# Patient Record
Sex: Female | Born: 1980 | Race: White | Hispanic: No
Health system: Southern US, Community
[De-identification: ages and names within clinical notes are randomized; demographics above are authoritative.]

---

## 2020-08-26 ENCOUNTER — Encounter: Payer: Self-pay | Admitting: Emergency Medicine

## 2020-08-26 ENCOUNTER — Other Ambulatory Visit: Payer: Self-pay

## 2020-08-26 ENCOUNTER — Emergency Department
Admission: EM | Admit: 2020-08-26 | Discharge: 2020-08-26 | Disposition: A | Discharge status: Home / Self Care | Payer: Medicaid Other | Attending: Emergency Medicine | Admitting: Emergency Medicine

## 2020-08-26 DIAGNOSIS — Z79890 Hormone replacement therapy (postmenopausal): Secondary | ICD-10-CM | POA: Diagnosis not present

## 2020-08-26 DIAGNOSIS — Z79899 Other long term (current) drug therapy: Secondary | ICD-10-CM | POA: Diagnosis not present

## 2020-08-26 DIAGNOSIS — S161XXA Strain of muscle, fascia and tendon at neck level, initial encounter: Secondary | ICD-10-CM | POA: Insufficient documentation

## 2020-08-26 MED ORDER — PREDNISONE 10 MG PO TABS: 10.0000 mg | ORAL_TABLET | Freq: Every day | ORAL | 0 refills | Status: AC

## 2020-08-26 MED ORDER — HYDROCODONE-ACETAMINOPHEN 5-325 MG PO TABS
1.0000 | ORAL_TABLET | ORAL | 0 refills | Status: AC | PRN
Start: 2020-08-26 — End: ?

## 2020-08-26 MED ORDER — TIZANIDINE HCL 4 MG PO TABS: 4.0000 mg | ORAL_TABLET | Freq: Three times a day (TID) | ORAL | 0 refills | Status: AC

## 2020-08-26 NOTE — Discharge Instructions (Signed)
Please take medications as prescribed.  Work on gentle stretching exercises.  If any increasing pain return to the emergency department.  Follow-up PCP if no improvement in 1 week.
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
## 2021-06-15 ENCOUNTER — Emergency Department (HOSPITAL_COMMUNITY): Payer: Medicaid Other

## 2021-06-15 ENCOUNTER — Inpatient Hospital Stay (HOSPITAL_COMMUNITY)
Admission: EM | Admit: 2021-06-15 | Discharge: 2021-07-03 | DRG: 917 | Disposition: A | Discharge status: Hospice | Payer: Medicaid Other | Attending: Family Medicine | Admitting: Family Medicine

## 2021-06-15 DIAGNOSIS — R1110 Vomiting, unspecified: Secondary | ICD-10-CM

## 2021-06-15 DIAGNOSIS — Z9911 Dependence on respirator [ventilator] status: Secondary | ICD-10-CM | POA: Diagnosis not present

## 2021-06-15 DIAGNOSIS — E43 Unspecified severe protein-calorie malnutrition: Secondary | ICD-10-CM | POA: Insufficient documentation

## 2021-06-15 DIAGNOSIS — F32A Depression, unspecified: Secondary | ICD-10-CM | POA: Diagnosis present

## 2021-06-15 DIAGNOSIS — Z452 Encounter for adjustment and management of vascular access device: Secondary | ICD-10-CM

## 2021-06-15 DIAGNOSIS — J9601 Acute respiratory failure with hypoxia: Secondary | ICD-10-CM | POA: Diagnosis present

## 2021-06-15 DIAGNOSIS — D72819 Decreased white blood cell count, unspecified: Secondary | ICD-10-CM | POA: Diagnosis present

## 2021-06-15 DIAGNOSIS — F419 Anxiety disorder, unspecified: Secondary | ICD-10-CM | POA: Diagnosis present

## 2021-06-15 DIAGNOSIS — R64 Cachexia: Secondary | ICD-10-CM | POA: Diagnosis present

## 2021-06-15 DIAGNOSIS — I5181 Takotsubo syndrome: Secondary | ICD-10-CM | POA: Diagnosis present

## 2021-06-15 DIAGNOSIS — L89151 Pressure ulcer of sacral region, stage 1: Secondary | ICD-10-CM | POA: Diagnosis present

## 2021-06-15 DIAGNOSIS — Z4659 Encounter for fitting and adjustment of other gastrointestinal appliance and device: Secondary | ICD-10-CM

## 2021-06-15 DIAGNOSIS — I5021 Acute systolic (congestive) heart failure: Secondary | ICD-10-CM | POA: Diagnosis present

## 2021-06-15 DIAGNOSIS — T402X1A Poisoning by other opioids, accidental (unintentional), initial encounter: Principal | ICD-10-CM | POA: Diagnosis present

## 2021-06-15 DIAGNOSIS — R579 Shock, unspecified: Secondary | ICD-10-CM | POA: Diagnosis not present

## 2021-06-15 DIAGNOSIS — E039 Hypothyroidism, unspecified: Secondary | ICD-10-CM | POA: Diagnosis present

## 2021-06-15 DIAGNOSIS — B9561 Methicillin susceptible Staphylococcus aureus infection as the cause of diseases classified elsewhere: Secondary | ICD-10-CM | POA: Diagnosis not present

## 2021-06-15 DIAGNOSIS — J690 Pneumonitis due to inhalation of food and vomit: Secondary | ICD-10-CM | POA: Diagnosis present

## 2021-06-15 DIAGNOSIS — G931 Anoxic brain damage, not elsewhere classified: Secondary | ICD-10-CM | POA: Diagnosis present

## 2021-06-15 DIAGNOSIS — R627 Adult failure to thrive: Secondary | ICD-10-CM | POA: Diagnosis present

## 2021-06-15 DIAGNOSIS — Z515 Encounter for palliative care: Secondary | ICD-10-CM

## 2021-06-15 DIAGNOSIS — R778 Other specified abnormalities of plasma proteins: Secondary | ICD-10-CM

## 2021-06-15 DIAGNOSIS — Z978 Presence of other specified devices: Secondary | ICD-10-CM

## 2021-06-15 DIAGNOSIS — G2581 Restless legs syndrome: Secondary | ICD-10-CM | POA: Diagnosis present

## 2021-06-15 DIAGNOSIS — Z9884 Bariatric surgery status: Secondary | ICD-10-CM

## 2021-06-15 DIAGNOSIS — Z66 Do not resuscitate: Secondary | ICD-10-CM | POA: Diagnosis not present

## 2021-06-15 DIAGNOSIS — Z681 Body mass index (BMI) 19 or less, adult: Secondary | ICD-10-CM | POA: Diagnosis not present

## 2021-06-15 DIAGNOSIS — M6282 Rhabdomyolysis: Secondary | ICD-10-CM | POA: Diagnosis present

## 2021-06-15 DIAGNOSIS — K219 Gastro-esophageal reflux disease without esophagitis: Secondary | ICD-10-CM | POA: Diagnosis present

## 2021-06-15 DIAGNOSIS — Z781 Physical restraint status: Secondary | ICD-10-CM

## 2021-06-15 DIAGNOSIS — L89311 Pressure ulcer of right buttock, stage 1: Secondary | ICD-10-CM | POA: Diagnosis present

## 2021-06-15 DIAGNOSIS — E8809 Other disorders of plasma-protein metabolism, not elsewhere classified: Secondary | ICD-10-CM | POA: Diagnosis present

## 2021-06-15 DIAGNOSIS — Z9151 Personal history of suicidal behavior: Secondary | ICD-10-CM

## 2021-06-15 DIAGNOSIS — G928 Other toxic encephalopathy: Secondary | ICD-10-CM | POA: Diagnosis present

## 2021-06-15 DIAGNOSIS — R680 Hypothermia, not associated with low environmental temperature: Secondary | ICD-10-CM | POA: Diagnosis present

## 2021-06-15 DIAGNOSIS — Z0189 Encounter for other specified special examinations: Secondary | ICD-10-CM

## 2021-06-15 DIAGNOSIS — G9340 Encephalopathy, unspecified: Secondary | ICD-10-CM | POA: Diagnosis present

## 2021-06-15 DIAGNOSIS — L89321 Pressure ulcer of left buttock, stage 1: Secondary | ICD-10-CM | POA: Diagnosis present

## 2021-06-15 DIAGNOSIS — Z20822 Contact with and (suspected) exposure to covid-19: Secondary | ICD-10-CM | POA: Diagnosis present

## 2021-06-15 DIAGNOSIS — E162 Hypoglycemia, unspecified: Secondary | ICD-10-CM

## 2021-06-15 DIAGNOSIS — Z635 Disruption of family by separation and divorce: Secondary | ICD-10-CM

## 2021-06-15 DIAGNOSIS — R451 Restlessness and agitation: Secondary | ICD-10-CM | POA: Diagnosis not present

## 2021-06-15 DIAGNOSIS — J9621 Acute and chronic respiratory failure with hypoxia: Secondary | ICD-10-CM

## 2021-06-15 DIAGNOSIS — E54 Ascorbic acid deficiency: Secondary | ICD-10-CM | POA: Diagnosis present

## 2021-06-15 DIAGNOSIS — Z01818 Encounter for other preprocedural examination: Secondary | ICD-10-CM

## 2021-06-15 DIAGNOSIS — L8990 Pressure ulcer of unspecified site, unspecified stage: Secondary | ICD-10-CM | POA: Insufficient documentation

## 2021-06-15 DIAGNOSIS — E876 Hypokalemia: Secondary | ICD-10-CM | POA: Diagnosis present

## 2021-06-15 DIAGNOSIS — Z7189 Other specified counseling: Secondary | ICD-10-CM

## 2021-06-15 LAB — URINALYSIS, ROUTINE W REFLEX MICROSCOPIC
Bacteria, UA: NONE SEEN
Bilirubin Urine: NEGATIVE
Glucose, UA: 150 mg/dL — AB
Ketones, ur: NEGATIVE mg/dL
Leukocytes,Ua: NEGATIVE
Nitrite: NEGATIVE
Protein, ur: 30 mg/dL — AB
Specific Gravity, Urine: 1.016 (ref 1.005–1.030)
pH: 5 (ref 5.0–8.0)

## 2021-06-15 LAB — COMPREHENSIVE METABOLIC PANEL
ALT: 45 U/L — ABNORMAL HIGH (ref 0–44)
AST: 61 U/L — ABNORMAL HIGH (ref 15–41)
Albumin: 2.3 g/dL — ABNORMAL LOW (ref 3.5–5.0)
Alkaline Phosphatase: 79 U/L (ref 38–126)
Anion gap: 7 (ref 5–15)
BUN: 21 mg/dL — ABNORMAL HIGH (ref 6–20)
CO2: 28 mmol/L (ref 22–32)
Calcium: 7.7 mg/dL — ABNORMAL LOW (ref 8.9–10.3)
Chloride: 110 mmol/L (ref 98–111)
Creatinine, Ser: 0.8 mg/dL (ref 0.44–1.00)
GFR, Estimated: >60 mL/min (ref >60)
Glucose, Bld: 41 mg/dL — CL (ref 70–99)
Potassium: 2.6 mmol/L — CL (ref 3.5–5.1)
Sodium: 145 mmol/L (ref 135–145)
Total Bilirubin: 0.5 mg/dL (ref 0.3–1.2)
Total Protein: 5.5 g/dL — ABNORMAL LOW (ref 6.5–8.1)

## 2021-06-15 LAB — CBC WITH DIFFERENTIAL/PLATELET
Abs Immature Granulocytes: 0 10*3/uL (ref 0.00–0.07)
Basophils Absolute: 0 10*3/uL (ref 0.0–0.1)
Basophils Relative: 1 %
Eosinophils Absolute: 0 10*3/uL (ref 0.0–0.5)
Eosinophils Relative: 0 %
HCT: 42.5 % (ref 36.0–46.0)
Hemoglobin: 12.9 g/dL (ref 12.0–15.0)
Lymphocytes Relative: 19 %
Lymphs Abs: 0.5 10*3/uL — ABNORMAL LOW (ref 0.7–4.0)
MCH: 27.3 pg (ref 26.0–34.0)
MCHC: 30.4 g/dL (ref 30.0–36.0)
MCV: 90 fL (ref 80.0–100.0)
Monocytes Absolute: 0.1 10*3/uL (ref 0.1–1.0)
Monocytes Relative: 4 %
Neutro Abs: 1.9 10*3/uL (ref 1.7–7.7)
Neutrophils Relative %: 76 %
Platelets: 218 10*3/uL (ref 150–400)
RBC: 4.72 MIL/uL (ref 3.87–5.11)
RDW: 22.5 % — ABNORMAL HIGH (ref 11.5–15.5)
WBC: 2.5 10*3/uL — ABNORMAL LOW (ref 4.0–10.5)
nRBC: 0 % (ref 0.0–0.2)
nRBC: 0 /100 WBC

## 2021-06-15 LAB — PROTIME-INR
INR: 1.1 (ref 0.8–1.2)
Prothrombin Time: 13.7 seconds (ref 11.4–15.2)

## 2021-06-15 LAB — I-STAT ARTERIAL BLOOD GAS, ED
Acid-Base Excess: 1 mmol/L (ref 0.0–2.0)
Bicarbonate: 27.3 mmol/L (ref 20.0–28.0)
Calcium, Ion: 1.15 mmol/L (ref 1.15–1.40)
HCT: 41 % (ref 36.0–46.0)
Hemoglobin: 13.9 g/dL (ref 12.0–15.0)
O2 Saturation: 89 %
Patient temperature: 96.7
Potassium: 2.6 mmol/L — CL (ref 3.5–5.1)
Sodium: 146 mmol/L — ABNORMAL HIGH (ref 135–145)
TCO2: 29 mmol/L (ref 22–32)
pCO2 arterial: 47.8 mmHg (ref 32.0–48.0)
pH, Arterial: 7.36 (ref 7.350–7.450)
pO2, Arterial: 56 mmHg — ABNORMAL LOW (ref 83.0–108.0)

## 2021-06-15 LAB — I-STAT BETA HCG BLOOD, ED (MC, WL, AP ONLY): I-stat hCG, quantitative: <5 m[IU]/mL (ref <5)

## 2021-06-15 LAB — CBG MONITORING, ED
Glucose-Capillary: 12 mg/dL — CL (ref 70–99)
Glucose-Capillary: 344 mg/dL — ABNORMAL HIGH (ref 70–99)
Glucose-Capillary: 87 mg/dL (ref 70–99)
Glucose-Capillary: 89 mg/dL (ref 70–99)

## 2021-06-15 LAB — ACETAMINOPHEN LEVEL: Acetaminophen (Tylenol), Serum: <10 ug/mL — ABNORMAL LOW (ref 10–30)

## 2021-06-15 LAB — RAPID URINE DRUG SCREEN, HOSP PERFORMED
Amphetamines: NOT DETECTED
Barbiturates: NOT DETECTED
Benzodiazepines: NOT DETECTED
Cocaine: NOT DETECTED
Opiates: NOT DETECTED
Tetrahydrocannabinol: NOT DETECTED

## 2021-06-15 LAB — MRSA NEXT GEN BY PCR, NASAL: MRSA by PCR Next Gen: NOT DETECTED

## 2021-06-15 LAB — TSH: TSH: 5.42 u[IU]/mL — ABNORMAL HIGH (ref 0.350–4.500)

## 2021-06-15 LAB — LACTIC ACID, PLASMA: Lactic Acid, Venous: 1.9 mmol/L (ref 0.5–1.9)

## 2021-06-15 LAB — ETHANOL: Alcohol, Ethyl (B): <10 mg/dL (ref <10)

## 2021-06-15 LAB — RESP PANEL BY RT-PCR (FLU A&B, COVID) ARPGX2
Influenza A by PCR: NEGATIVE
Influenza B by PCR: NEGATIVE
SARS Coronavirus 2 by RT PCR: NEGATIVE

## 2021-06-15 LAB — GLUCOSE, CAPILLARY
Glucose-Capillary: 108 mg/dL — ABNORMAL HIGH (ref 70–99)
Glucose-Capillary: 123 mg/dL — ABNORMAL HIGH (ref 70–99)
Glucose-Capillary: 141 mg/dL — ABNORMAL HIGH (ref 70–99)
Glucose-Capillary: 54 mg/dL — ABNORMAL LOW (ref 70–99)

## 2021-06-15 LAB — STREP PNEUMONIAE URINARY ANTIGEN: Strep Pneumo Urinary Antigen: NEGATIVE

## 2021-06-15 LAB — TROPONIN I (HIGH SENSITIVITY): Troponin I (High Sensitivity): 2116 ng/L (ref <18)

## 2021-06-15 LAB — SALICYLATE LEVEL: Salicylate Lvl: <7 mg/dL — ABNORMAL LOW (ref 7.0–30.0)
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
## 2021-06-15 IMAGING — DX DG CHEST 1V PORT
2 series · 2 of 2 positions shown · non-contrast
Comparison: None.

CLINICAL DATA: Onset headache and chest pain this morning.

EXAM:
PORTABLE CHEST 1 VIEW

[chest ap (1 of 2)]
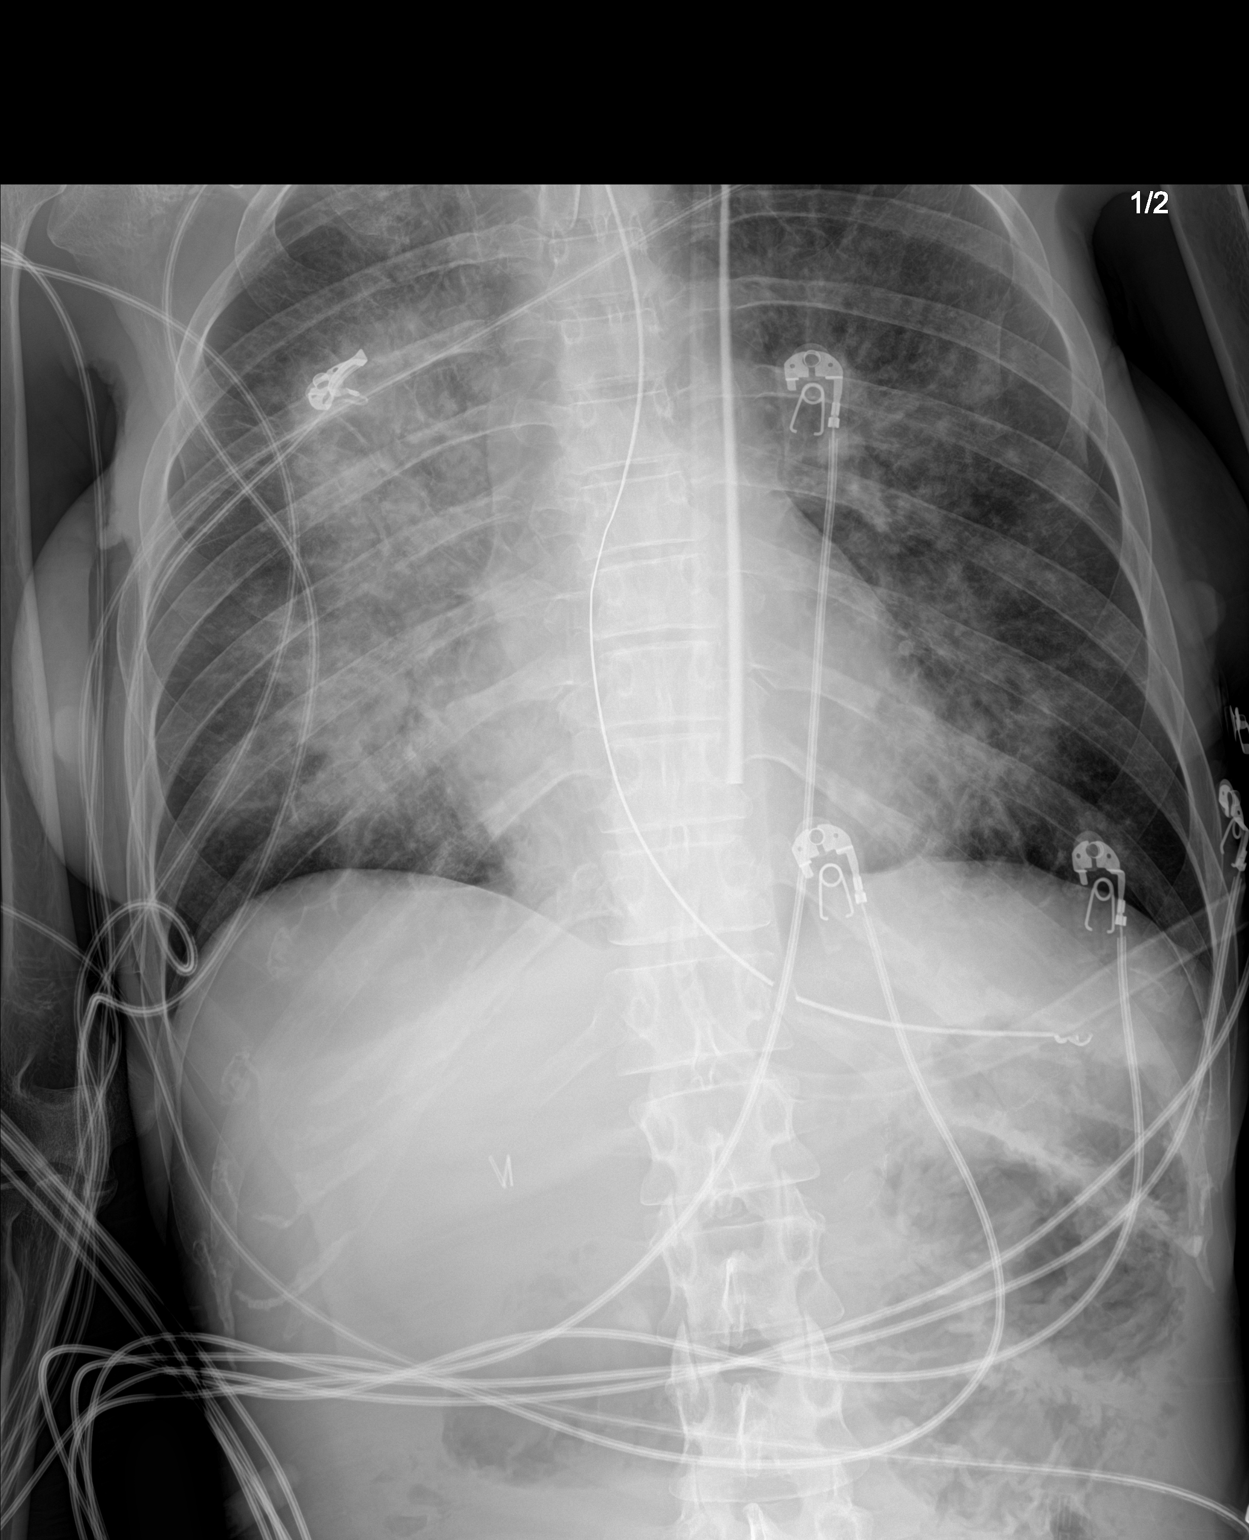

[chest ap (2 of 2)]
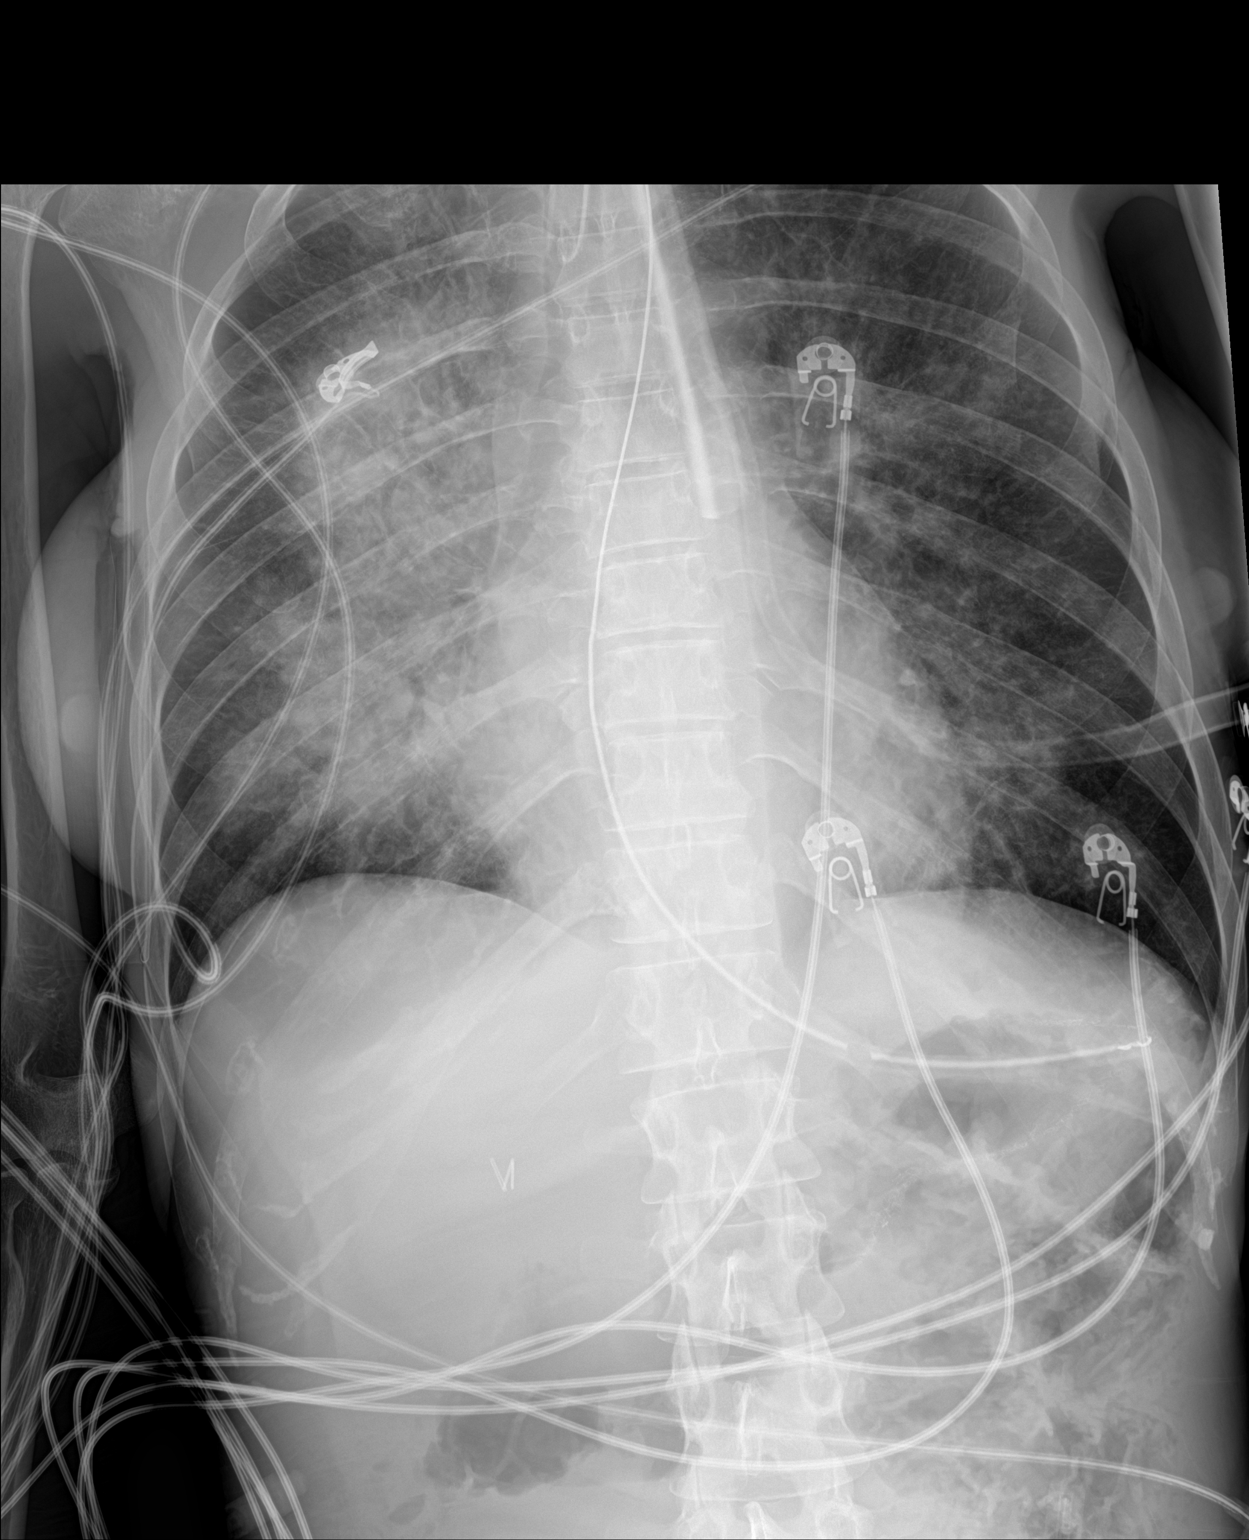

[2 of 2 positions shown; findings below may reference images not displayed]

FINDINGS: Endotracheal tube is in place with the tip in good position just
below the clavicular heads. NG tube tip is in the fundus of the
stomach. There are bilateral pulmonary opacities, worse on the
right. No pneumothorax or pleural effusion. Heart size is normal.
IMPRESSION: ETT and NG tube in good position.

Right greater than left perihilar opacities could be due to
pneumonia or edema.
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
## 2021-06-15 MED ORDER — PROPOFOL 1000 MG/100ML IV EMUL: 5.0000 ug/kg/min | INTRAVENOUS | Status: DC

## 2021-06-15 MED ORDER — FENTANYL CITRATE (PF) 100 MCG/2ML IJ SOLN
12.5000 ug | INTRAMUSCULAR | Status: DC | PRN
  Administered 2021-06-15: 50 ug via INTRAVENOUS
  Filled 2021-06-15: qty 2

## 2021-06-15 MED ORDER — DEXTROSE 50 % IV SOLN
INTRAVENOUS | Status: AC
  Administered 2021-06-15: 50 mL
  Filled 2021-06-15: qty 50

## 2021-06-15 MED ORDER — POTASSIUM CHLORIDE 20 MEQ PO PACK
40.0000 meq | PACK | Freq: Two times a day (BID) | ORAL | Status: AC
  Administered 2021-06-15 (×2): 40 meq
  Filled 2021-06-15 (×2): qty 2

## 2021-06-15 MED ORDER — SODIUM CHLORIDE 0.9 % IV SOLN: 250.0000 mL | INTRAVENOUS | Status: DC

## 2021-06-15 MED ORDER — POTASSIUM CHLORIDE 10 MEQ/100ML IV SOLN
10.0000 meq | INTRAVENOUS | Status: AC
  Administered 2021-06-15 (×4): 10 meq via INTRAVENOUS
  Filled 2021-06-15 (×2): qty 100

## 2021-06-15 MED ORDER — PROPOFOL 1000 MG/100ML IV EMUL
5.0000 ug/kg/min | INTRAVENOUS | Status: DC
  Administered 2021-06-15: 10 ug/kg/min via INTRAVENOUS

## 2021-06-15 MED ORDER — POLYETHYLENE GLYCOL 3350 17 G PO PACK
17.0000 g | PACK | Freq: Every day | ORAL | Status: DC
  Administered 2021-06-15 – 2021-06-20 (×4): 17 g
  Filled 2021-06-15 (×3): qty 1

## 2021-06-15 MED ORDER — ORAL CARE MOUTH RINSE
15.0000 mL | OROMUCOSAL | Status: DC
  Administered 2021-06-15 – 2021-06-28 (×127): 15 mL via OROMUCOSAL

## 2021-06-15 MED ORDER — DOCUSATE SODIUM 50 MG/5ML PO LIQD
100.0000 mg | Freq: Two times a day (BID) | ORAL | Status: DC
  Administered 2021-06-15 – 2021-06-17 (×5): 100 mg
  Filled 2021-06-15 (×6): qty 10

## 2021-06-15 MED ORDER — OCTREOTIDE ACETATE 50 MCG/ML IJ SOLN
50.0000 ug | Freq: Once | INTRAMUSCULAR | Status: AC
  Administered 2021-06-15: 50 ug via SUBCUTANEOUS
  Filled 2021-06-15: qty 1

## 2021-06-15 MED ORDER — DEXMEDETOMIDINE HCL IN NACL 400 MCG/100ML IV SOLN
0.0000 ug/kg/h | INTRAVENOUS | Status: AC
  Administered 2021-06-15: 0.4 ug/kg/h via INTRAVENOUS
  Administered 2021-06-16 (×2): 1 ug/kg/h via INTRAVENOUS
  Filled 2021-06-15 (×5): qty 100

## 2021-06-15 MED ORDER — PANTOPRAZOLE SODIUM 40 MG PO PACK
40.0000 mg | PACK | Freq: Every day | ORAL | Status: DC
  Administered 2021-06-16 – 2021-07-01 (×16): 40 mg
  Filled 2021-06-15 (×16): qty 20

## 2021-06-15 MED ORDER — FENTANYL CITRATE (PF) 100 MCG/2ML IJ SOLN
25.0000 ug | INTRAMUSCULAR | Status: DC | PRN
  Administered 2021-06-15 – 2021-06-18 (×2): 100 ug via INTRAVENOUS
  Administered 2021-06-18: 50 ug via INTRAVENOUS
  Administered 2021-06-18 – 2021-06-23 (×8): 100 ug via INTRAVENOUS
  Filled 2021-06-15 (×12): qty 2

## 2021-06-15 MED ORDER — ONDANSETRON HCL 4 MG/2ML IJ SOLN: 4.0000 mg | Freq: Four times a day (QID) | INTRAMUSCULAR | Status: DC | PRN

## 2021-06-15 MED ORDER — CHLORHEXIDINE GLUCONATE 0.12% ORAL RINSE (MEDLINE KIT)
15.0000 mL | Freq: Two times a day (BID) | OROMUCOSAL | Status: DC
  Administered 2021-06-15 – 2021-07-01 (×32): 15 mL via OROMUCOSAL

## 2021-06-15 MED ORDER — DEXTROSE 5 % IV SOLN: INTRAVENOUS | Status: DC

## 2021-06-15 MED ORDER — ETOMIDATE 2 MG/ML IV SOLN
INTRAVENOUS | Status: AC | PRN
  Administered 2021-06-15: 20 mg via INTRAVENOUS

## 2021-06-15 MED ORDER — ROCURONIUM BROMIDE 50 MG/5ML IV SOLN
INTRAVENOUS | Status: AC | PRN
  Administered 2021-06-15: 60 mg/kg/h via INTRAVENOUS

## 2021-06-15 MED ORDER — SODIUM CHLORIDE 0.9 % IV BOLUS
1000.0000 mL | Freq: Once | INTRAVENOUS | Status: AC
  Administered 2021-06-15: 1000 mL via INTRAVENOUS

## 2021-06-15 MED ORDER — DEXTROSE 50 % IV SOLN
2.0000 | Freq: Once | INTRAVENOUS | Status: AC
  Administered 2021-06-15: 100 mL via INTRAVENOUS

## 2021-06-15 MED ORDER — HEPARIN SODIUM (PORCINE) 5000 UNIT/ML IJ SOLN
5000.0000 [IU] | Freq: Three times a day (TID) | INTRAMUSCULAR | Status: DC
  Administered 2021-06-15 – 2021-06-16 (×2): 5000 [IU] via SUBCUTANEOUS
  Filled 2021-06-15 (×2): qty 1

## 2021-06-15 MED ORDER — FENTANYL CITRATE (PF) 100 MCG/2ML IJ SOLN: 25.0000 ug | INTRAMUSCULAR | Status: DC | PRN

## 2021-06-15 MED ORDER — POTASSIUM CHLORIDE 10 MEQ/100ML IV SOLN
10.0000 meq | INTRAVENOUS | Status: AC
  Administered 2021-06-15 – 2021-06-16 (×3): 10 meq via INTRAVENOUS
  Filled 2021-06-15 (×3): qty 100

## 2021-06-15 MED ORDER — CHLORHEXIDINE GLUCONATE CLOTH 2 % EX PADS
6.0000 | MEDICATED_PAD | Freq: Every day | CUTANEOUS | Status: DC
  Administered 2021-06-16 – 2021-07-01 (×16): 6 via TOPICAL

## 2021-06-15 MED ORDER — DOCUSATE SODIUM 100 MG PO CAPS: 100.0000 mg | ORAL_CAPSULE | Freq: Two times a day (BID) | ORAL | Status: DC | PRN

## 2021-06-15 MED ORDER — NOREPINEPHRINE 4 MG/250ML-% IV SOLN
2.0000 ug/min | INTRAVENOUS | Status: DC
  Administered 2021-06-15: 5 ug/min via INTRAVENOUS
  Administered 2021-06-16: 9 ug/min via INTRAVENOUS
  Administered 2021-06-17: 6 ug/min via INTRAVENOUS
  Filled 2021-06-15 (×2): qty 250
  Filled 2021-06-15: qty 500

## 2021-06-15 MED ORDER — SODIUM CHLORIDE 0.9 % IV SOLN: INTRAVENOUS | Status: DC

## 2021-06-15 MED ORDER — PIPERACILLIN-TAZOBACTAM 3.375 G IVPB
3.3750 g | Freq: Three times a day (TID) | INTRAVENOUS | Status: DC
  Administered 2021-06-15 – 2021-06-16 (×2): 3.375 g via INTRAVENOUS
  Filled 2021-06-15 (×2): qty 50

## 2021-06-15 MED ORDER — MAGNESIUM SULFATE 2 GM/50ML IV SOLN
2.0000 g | Freq: Once | INTRAVENOUS | Status: AC
  Administered 2021-06-15: 2 g via INTRAVENOUS
  Filled 2021-06-15: qty 50

## 2021-06-15 MED ORDER — POLYETHYLENE GLYCOL 3350 17 G PO PACK
17.0000 g | PACK | Freq: Every day | ORAL | Status: DC | PRN
  Filled 2021-06-15: qty 1

## 2021-06-15 NOTE — Progress Notes (Signed)
Pt transported to 3M05 without any apparent complications.

## 2021-06-15 NOTE — Progress Notes (Addendum)
eLink Physician-Brief Progress Note Patient Name: Sonia Howard DOB: [birth year] MRN: [id number]   Date of Service  06/15/2021  HPI/Events of Note  Bedside RN asking for PRN order for sedation, as patient is becoming slightly more responsive, she also threw up enteral K+ replacement so bedside RN is asking for a run of iv K+ replacement.  eICU Interventions  PRN Fentanyl ordered, KCL 30 meq iv over 3 hours ordered.        [name] 06/15/2021, 10:16 PM
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
## 2021-06-15 NOTE — ED Notes (Signed)
Peri care preformed before and after foley placement

## 2021-06-15 NOTE — Progress Notes (Signed)
Pt transported to CT and back to ED room 15.

## 2021-06-15 NOTE — Code Documentation (Signed)
Good color change. Bilateral breath sound heard. 25 at the lip

## 2021-06-15 NOTE — Progress Notes (Signed)
eLink Physician-Brief Progress Note Patient Name: Sonia Howard DOB: [birth year] MRN: [id number]   Date of Service  06/15/2021  HPI/Events of Note  Patient with sub-optimal sedation.  eICU Interventions  Precedex gtt started and PRN Fentanyl dosing changed to 25-100 mcg iv Q 4 hours PRN.        Thomasene Lot Zakariah Urwin 06/15/2021, 10:51 PM

## 2021-06-15 NOTE — Progress Notes (Signed)
Pharmacy Antibiotic Note  Sonia Howard is a 40 y.o. female admitted on 06/15/2021 presenting unresponsive, intubated in ED and concern for aspiration.  Pharmacy has been consulted for Zosyn dosing.  Plan: Zosyn 3.375g IV every 8 hours (extended infusion) Monitor renal function, clinical progression and LOT  Height: 5\' 8"  (172.7 cm) Weight: 60 kg (132 lb 4.4 oz) IBW/kg (Calculated) : 63.9  Temp (24hrs), Avg:96.8 F (36 C), Min:95.9 F (35.5 C), Max:97.7 F (36.5 C)  Recent Labs  Lab 06/15/21 1322  WBC 2.5*  CREATININE 0.80  LATICACIDVEN 1.9    Estimated Creatinine Clearance: 88.5 mL/min (by C-G formula based on SCr of 0.8 mg/dL).    Not on File  08/16/21, PharmD Clinical Pharmacist ED Pharmacist Phone # [phone] 06/15/2021 5:41 PM

## 2021-06-15 NOTE — ED Provider Notes (Signed)
Emergency Department Provider Note   I have reviewed the triage vital signs and the nursing notes.   HISTORY  Chief Complaint No chief complaint on file.   HPI Sonia Howard is a 40 y.o. female with reported past history of overdose and unintentional weight loss presents to the emergency department after being found unresponsive by family.  Patient was at a friend's house.  She was last seen normal at 5 PM yesterday.  Family called 911 when the patient was found minimally responsive.  EMS report that on scene patient was breathing spontaneously but was otherwise unresponsive.  Patient had a blood sugar of 23 and was given 2 A of D50 increasing it into the 250 range.  They gave 2 mg of Narcan with no response.  The patient required assisted ventilation with bag-valve-mask.  IV fluids were started and the patient was transported emergently to [facility].  No additional information is available at the time of arrival.  Level 5 caveat: Unresponsive    No past medical history on file.  There are no problems to display for this patient.   Allergies Patient has no allergy information on record.  No family history on file.  Social History    Review of Systems  Level 5 caveat: Unresponsive   ____________________________________________   PHYSICAL EXAM:  VITAL SIGNS: ED Triage Vitals [06/15/21 1316]  Enc Vitals Group     BP 122/68     Pulse Rate (!) 111     Resp (!) 22     Temp      Temp src      SpO2 96 %   Constitutional: Unresponsive.  Eyes: Conjunctivae are normal. Pupils are 7 mm and sluggish bilaterally. Mild periorbital edema to the left eye.  Head: Atraumatic. Nose: No congestion/rhinnorhea. Mouth/Throat: Mucous membranes are moist.  Neck: No stridor.   Cardiovascular: Tachycardia. Good peripheral circulation. Grossly normal heart sounds.   Respiratory: Agonal respirations but regular.  No retractions. Lungs CTAB. Gastrointestinal: Soft and nontender. No  distention.  Musculoskeletal: No lower extremity tenderness with 1+ pitting edema in the B/L LEs. No gross deformities of extremities. Neurologic: Spontaneous respirations but no other spontaneous movement. Not responding to painful stimuli. Positive gag reflex.  Skin:  Skin is warm, dry and intact. No rash noted.  ____________________________________________   LABS (all labs ordered are listed, but only abnormal results are displayed)  Labs Reviewed  ACETAMINOPHEN LEVEL - Abnormal; Notable for the following components:      Result Value   Acetaminophen (Tylenol), Serum <10 (*)    All other components within normal limits  COMPREHENSIVE METABOLIC PANEL - Abnormal; Notable for the following components:   Potassium 2.6 (*)    Glucose, Bld 41 (*)    BUN 21 (*)    Calcium 7.7 (*)    Total Protein 5.5 (*)    Albumin 2.3 (*)    AST 61 (*)    ALT 45 (*)    All other components within normal limits  SALICYLATE LEVEL - Abnormal; Notable for the following components:   Salicylate Lvl <7.0 (*)    All other components within normal limits  CBC WITH DIFFERENTIAL/PLATELET - Abnormal; Notable for the following components:   WBC 2.5 (*)    RDW 22.5 (*)    Lymphs Abs 0.5 (*)    All other components within normal limits  TSH - Abnormal; Notable for the following components:   TSH 5.420 (*)    All other components within  normal limits  CBG MONITORING, ED - Abnormal; Notable for the following components:   Glucose-Capillary 12 (*)    All other components within normal limits  CBG MONITORING, ED - Abnormal; Notable for the following components:   Glucose-Capillary 344 (*)    All other components within normal limits  TROPONIN I (HIGH SENSITIVITY) - Abnormal; Notable for the following components:   Troponin I (High Sensitivity) 2,116 (*)    All other components within normal limits  RESP PANEL BY RT-PCR (FLU A&B, COVID) ARPGX2  ETHANOL  PROTIME-INR  LACTIC ACID, PLASMA  URINALYSIS, ROUTINE  W REFLEX MICROSCOPIC  RAPID URINE DRUG SCREEN, HOSP PERFORMED  LACTIC ACID, PLASMA  BLOOD GAS, ARTERIAL  I-STAT BETA HCG BLOOD, ED ([facility], [facility], [facility] ONLY)  CBG MONITORING, ED  TROPONIN I (HIGH SENSITIVITY)   ____________________________________________  EKG   EKG Interpretation  Date/Time:  Wednesday June 15 2021 13:19:16 EDT Ventricular Rate:  115 PR Interval:  205 QRS Duration: 84 QT Interval:  302 QTC Calculation: 418 R Axis:   83 Text Interpretation: Sinus tachycardia Prolonged PR interval Biatrial enlargement Repol abnrm suggests ischemia, diffuse leads No old tracing for comparison Confirmed by Alona Bene [id number]) on 06/15/2021 1:35:08 PM         ____________________________________________  RADIOLOGY  DG Chest Portable 1 View  Result Date: 06/15/2021 CLINICAL DATA:  Onset headache and chest pain this morning. EXAM: PORTABLE CHEST 1 VIEW COMPARISON:  None. FINDINGS: Endotracheal tube is in place with the tip in good position just below the clavicular heads. NG tube tip is in the fundus of the stomach. There are bilateral pulmonary opacities, worse on the right. No pneumothorax or pleural effusion. Heart size is normal. IMPRESSION: ETT and NG tube in good position. Right greater than left perihilar opacities could be due to pneumonia or edema. Electronically Signed   By: Drusilla Kanner M.D.   On: 06/15/2021 14:16    ____________________________________________   PROCEDURES  Procedure(s) performed:   Procedure Name: Intubation Date/Time: 06/15/2021 1:47 PM Performed by: Maia Plan, MD Pre-anesthesia Checklist: Patient identified, Patient being monitored, Emergency Drugs available, Timeout performed and Suction available Oxygen Delivery Method: Non-rebreather mask Preoxygenation: Pre-oxygenation with 100% oxygen Induction Type: Rapid sequence Ventilation: Mask ventilation without difficulty Laryngoscope Size: Glidescope and 3 Grade View: Grade II Tube size: 7.5  mm Number of attempts: 1 Airway Equipment and Method: Video-laryngoscopy Placement Confirmation: ETT inserted through vocal cords under direct vision, CO2 detector and Breath sounds checked- equal and bilateral Secured at: 25 cm Tube secured with: ETT holder Dental Injury: Teeth and Oropharynx as per pre-operative assessment     .Critical Care  Date/Time: 06/15/2021 1:47 PM Performed by: Maia Plan, MD Authorized by: Maia Plan, MD   Critical care provider statement:    Critical care time (minutes):  45   Critical care time was exclusive of:  Separately billable procedures and treating other patients and teaching time   Critical care was necessary to treat or prevent imminent or life-threatening deterioration of the following conditions:  Respiratory failure   Critical care was time spent personally by me on the following activities:  Discussions with consultants, evaluation of patient's response to treatment, examination of patient, ordering and performing treatments and interventions, ordering and review of laboratory studies, ordering and review of radiographic studies, pulse oximetry, re-evaluation of patient's condition, obtaining history from patient or surrogate, review of old charts, blood draw for specimens and development of treatment plan with patient or surrogate  I assumed direction of critical care for this patient from another provider in my specialty: no     Care discussed with: admitting provider     ____________________________________________   INITIAL IMPRESSION / ASSESSMENT AND PLAN / ED COURSE  Pertinent labs & imaging results that were available during my care of the patient were reviewed by me and considered in my medical decision making (see chart for details).   Patient arrives to the emergency department unresponsive with bag-valve-mask assist ventilations.  Last normal at 5 PM.  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  No  records available for review at the time of patient arrival.  Not responding to Narcan with EMS.  Respirations are agonal.  Unclear etiology of the patient's mental status change.  Will need CT imaging of the head and face with some swelling around the left eye.  No obvious seizure activity.  Intubation performed immediately upon arrival with no complications.  Patient is maintaining her blood pressure but shortly after arrival was found to be hypoglycemic once again.  She was given 2 A of D50 and we will started D10 infusion.   03:10 PM  Patient's troponin > 2000. Will consult with Cardiology.   Spoke with Cardiology. They will consult.   Discussed patient's case with ICU to request admission. Patient and family (if present) updated with plan. Care transferred to ICU service.  I reviewed all nursing notes, vitals, pertinent old records, EKGs, labs, imaging (as available).  ____________________________________________  FINAL CLINICAL IMPRESSION(S) / ED DIAGNOSES  Final diagnoses:  Acute respiratory failure with hypoxia (HCC)     MEDICATIONS GIVEN DURING THIS VISIT:  Medications  propofol (DIPRIVAN) 1000 MG/100ML infusion (10 mcg/kg/min  60 kg (Order-Specific) Intravenous New Bag/Given 06/15/21 1329)  dextrose 50 % solution 100 mL (has no administration in time range)  dextrose 5 % solution (has no administration in time range)  potassium chloride (KLOR-CON) packet 40 mEq (has no administration in time range)  potassium chloride 10 mEq in 100 mL IVPB (has no administration in time range)  magnesium sulfate IVPB 2 g 50 mL (has no administration in time range)  sodium chloride 0.9 % bolus 1,000 mL (1,000 mLs Intravenous New Bag/Given 06/15/21 1359)     Note:  This document was prepared using Dragon voice recognition software and may include unintentional dictation errors.  Alona Bene, MD, [facility]    Cote Mayabb, Arlyss Repress, MD 06/19/21 Izell Doon

## 2021-06-15 NOTE — Consult Note (Addendum)
Cardiology Consultation:   Patient ID: Sonia Howard MRN: [id number]; DOB: 21-Oct-1981  Admit date: 06/15/2021 Date of Consult: 06/15/2021  PCP:  No primary care provider on file.   [facility] Providers Cardiologist:  None   {     Patient Profile:   Sonia Howard is a 40 y.o. female with a hx of migraine, anxiety, depression, hypothyroidism, iron deficiency anemia, RLS, GERD, hx of overdose with opioid, tobacco abuse, hx of lap vertical sleeve gastrectomy in 2015, who is being seen 06/15/2021 for the evaluation of elevated trop at the request of Dr. Jacqulyn Bath.   History of Present Illness:   Sonia Howard has no known cardiac disease in the past. Current chart and merge chart both reviewed.   She presented to ER today after found unresponsive by her family members. Per ER and ICU team reports,  patient was last seen normal at 5pm on 06/14/21, family found her minimally responsive at the house today. Family called 911, EMS reports patient was breathing spontaneously at the scene, not responding otherwise. Family had reported that she has been losing a lot weight. She was noted with hypoglycemia with glucose 23, which improved with D50 bolus. She had no improvement after 2mg  Narcan administration. Patient required BMV for ventilation support  due to agonal breathing en-route. Upon arrival to ER, she was intubated and NG was inserted immediately. She was noted with recurrent hypoglycemia requiring D10 infusion. ICU is called for admission.   Diagnostic showed hypokalemia 2.6, glucose 41, BUN 21, albumin 2.3, AST 61, ALT 45. Hs trop 2116. EKG showed sinus tachycardia 115 bpm.  Lactic acid WNL. Leukopenia with WBC 2500. INR 1.1. Tylenol <10. Salicylate <7. ETOH <10. HCG negative. TSH elevated 5.4. Flu and COVID negative. Urine tox screen/CK/HIV/BNP/Blood culture pending. CXR showed right greater than left perihilar opacities could be due to pneumonia or edema. CTH, CT facial, and CT C spine showed no acute  traumatic findings. Septal thickening in the upper lungs consistent with pulmonary edema. She is tachycardia with HR 110s. BP stable, pox 96-100% on 40% FIO2. Cardiology is consulted for elevated trop.     Home Medications:  Prior to Admission medications   Not on File    Inpatient Medications: Scheduled Meds:  docusate  100 mg Per Tube BID   heparin  5,000 Units Subcutaneous Q8H   octreotide  50 mcg Subcutaneous Once   [START ON 06/16/2021] pantoprazole sodium  40 mg Per Tube Daily   polyethylene glycol  17 g Per Tube Daily   potassium chloride  40 mEq Per Tube BID   Continuous Infusions:  dexmedetomidine (PRECEDEX) IV infusion     dextrose 50 mL/hr at 06/15/21 1547   magnesium sulfate bolus IVPB     potassium chloride     propofol (DIPRIVAN) infusion     PRN Meds: docusate sodium, fentaNYL (SUBLIMAZE) injection, ondansetron (ZOFRAN) IV, polyethylene glycol  Allergies:   Not on File  Social History:   Social History   Socioeconomic History   Marital status: Unknown    Spouse name: Not on file   Number of children: Not on file   Years of education: Not on file   Highest education level: Not on file  Occupational History   Not on file  Tobacco Use   Smoking status: Not on file   Smokeless tobacco: Not on file  Substance and Sexual Activity   Alcohol use: Not on file   Drug use: Not on file   Sexual activity: Not on  file  Other Topics Concern   Not on file  Social History Narrative   Not on file   Social Determinants of Health   Financial Resource Strain: Not on file  Food Insecurity: Not on file  Transportation Needs: Not on file  Physical Activity: Not on file  Stress: Not on file  Social Connections: Not on file  Intimate Partner Violence: Not on file    Family History:   Mother: heart disease, DM, COPD Brother: ETOH abuse Father: HTN   ROS:  Unable to perform due to encephalopathy /sedation/ mechanical ventilation   Physical Exam/Data:    Vitals:   06/15/21 1330 06/15/21 1345 06/15/21 1400 06/15/21 1600  BP: 126/72 129/76 131/77   Pulse: (!) 121 (!) 116 (!) 113   Resp: (!) 24 17 18    SpO2: 98% 100% 100%   Weight:    60 kg  Height:       No intake or output data in the 24 hours ending 06/15/21 1615 Last 3 Weights 06/15/2021  Weight (lbs) 132 lb 4.4 oz  Weight (kg) 60 kg     Body mass index is 20.11 kg/m.   Vitals:  Vitals:   06/15/21 1345 06/15/21 1400  BP: 129/76 131/77  Pulse: (!) 116 (!) 113  Resp: 17 18  SpO2: 100% 100%   General Appearance: Laying in bed, cachetic, receiving propofol sedation  HEENT: Head atraumatic. Pupils round and reactive to light sluggishly bilaterally Neck: Supple, trachea midline, no JVDs Cardiovascular: Regular rhythm tachycardiac, normal S1-S2,  no murmur/rub/gallop Respiratory: mechanical ventilator support,  TV 510, RR15, PEEP 5, FIO2 40%  ; pox 88-95% Gastrointestinal: NG inserted, bile color secretion noted. Abdomen soft, non-distended Extremities: No spontaneous movement of all extremity. BLE 2-3+ pitting edema Genitourinary: Foley with amber colored urine  Musculoskeletal: Generalized muscular atrophy, unable assess strength  Skin: Intact, warm, dry. No rashes or petechiae noted in exposed areas.  Neurologic: Somnolent, not open eyes to sternum rub, pupils reactive to light sluggishly, on ventilator support,  on sedation with propofol, neuro exam is limited  Psychiatric: Unable to assess   EKG:  The EKG was personally reviewed and demonstrates:  EKG 06/15/21 at 13:19 showed sinus tachycardia rate of 115 bpm, ST depression of V4, suspect rate related, artifacts   Telemetry:  Telemetry was personally reviewed and demonstrates:  Sinus tachycardia with rate of 110-120s, occasional PVCs    Relevant CV Studies:  N/A   Laboratory Data:  High Sensitivity Troponin:   Recent Labs  Lab 06/15/21 1322  TROPONINIHS 2,116*     Chemistry Recent Labs  Lab 06/15/21 1322  NA  145  K 2.6*  CL 110  CO2 28  GLUCOSE 41*  BUN 21*  CREATININE 0.80  CALCIUM 7.7*  GFRNONAA >60  ANIONGAP 7    Recent Labs  Lab 06/15/21 1322  PROT 5.5*  ALBUMIN 2.3*  AST 61*  ALT 45*  ALKPHOS 79  BILITOT 0.5   Hematology Recent Labs  Lab 06/15/21 1322  WBC 2.5*  RBC 4.72  HGB 12.9  HCT 42.5  MCV 90.0  MCH 27.3  MCHC 30.4  RDW 22.5*  PLT 218   BNPNo results for input(s): BNP, PROBNP in the last 168 hours.  DDimer No results for input(s): DDIMER in the last 168 hours.   Radiology/Studies:  CT Head Wo Contrast  Result Date: 06/15/2021 CLINICAL DATA:  Found on the ground.  Intoxicated. EXAM: CT HEAD WITHOUT CONTRAST CT MAXILLOFACIAL WITHOUT CONTRAST CT CERVICAL SPINE  WITHOUT CONTRAST TECHNIQUE: Multidetector CT imaging of the head, cervical spine, and maxillofacial structures were performed using the standard protocol without intravenous contrast. Multiplanar CT image reconstructions of the cervical spine and maxillofacial structures were also generated. COMPARISON:  None. FINDINGS: CT HEAD FINDINGS Brain: The brain shows a normal appearance without evidence of malformation, atrophy, old or acute small or large vessel infarction, mass lesion, hemorrhage, hydrocephalus or extra-axial collection. Vascular: No hyperdense vessel. No evidence of atherosclerotic calcification. Skull: Normal.  No traumatic finding.  No focal bone lesion. Sinuses/Orbits: Sinuses are clear. Orbits appear normal. Mastoids are clear. Other: None significant CT MAXILLOFACIAL FINDINGS Osseous: No evidence of facial fracture. Orbits: No orbital injury. Sinuses: Some mucosal thickening and fluid of the right maxillary sinus. Other sinuses are clear. Soft tissues: No soft tissue face abnormality of significance. Endotracheal tube and orogastric tube in place. CT CERVICAL SPINE FINDINGS Alignment: No malalignment. Skull base and vertebrae: No fracture or focal bone lesion. Soft tissues and spinal canal: No soft  tissue neck lesion identified. Endotracheal tube and orogastric tube in place. Disc levels: No degenerative disc disease. No bony stenosis of the canal or foramina. Upper chest: Septal thickening in the upper lungs could reflect early edema. Other: None IMPRESSION: Head CT: Normal Maxillofacial CT: No traumatic finding. Some mucosal inflammatory disease of the right maxillary sinus. Cervical spine CT: No traumatic finding.  Normal. Endotracheal tube and orogastric tube in place. Septal thickening in the upper lungs consistent with pulmonary edema. Electronically Signed   By: Paulina Fusi M.D.   On: 06/15/2021 15:35   CT Cervical Spine Wo Contrast  Result Date: 06/15/2021 CLINICAL DATA:  Found on the ground.  Intoxicated. EXAM: CT HEAD WITHOUT CONTRAST CT MAXILLOFACIAL WITHOUT CONTRAST CT CERVICAL SPINE WITHOUT CONTRAST TECHNIQUE: Multidetector CT imaging of the head, cervical spine, and maxillofacial structures were performed using the standard protocol without intravenous contrast. Multiplanar CT image reconstructions of the cervical spine and maxillofacial structures were also generated. COMPARISON:  None. FINDINGS: CT HEAD FINDINGS Brain: The brain shows a normal appearance without evidence of malformation, atrophy, old or acute small or large vessel infarction, mass lesion, hemorrhage, hydrocephalus or extra-axial collection. Vascular: No hyperdense vessel. No evidence of atherosclerotic calcification. Skull: Normal.  No traumatic finding.  No focal bone lesion. Sinuses/Orbits: Sinuses are clear. Orbits appear normal. Mastoids are clear. Other: None significant CT MAXILLOFACIAL FINDINGS Osseous: No evidence of facial fracture. Orbits: No orbital injury. Sinuses: Some mucosal thickening and fluid of the right maxillary sinus. Other sinuses are clear. Soft tissues: No soft tissue face abnormality of significance. Endotracheal tube and orogastric tube in place. CT CERVICAL SPINE FINDINGS Alignment: No  malalignment. Skull base and vertebrae: No fracture or focal bone lesion. Soft tissues and spinal canal: No soft tissue neck lesion identified. Endotracheal tube and orogastric tube in place. Disc levels: No degenerative disc disease. No bony stenosis of the canal or foramina. Upper chest: Septal thickening in the upper lungs could reflect early edema. Other: None IMPRESSION: Head CT: Normal Maxillofacial CT: No traumatic finding. Some mucosal inflammatory disease of the right maxillary sinus. Cervical spine CT: No traumatic finding.  Normal. Endotracheal tube and orogastric tube in place. Septal thickening in the upper lungs consistent with pulmonary edema. Electronically Signed   By: Paulina Fusi M.D.   On: 06/15/2021 15:35   DG Chest Portable 1 View  Result Date: 06/15/2021 CLINICAL DATA:  Onset headache and chest pain this morning. EXAM: PORTABLE CHEST 1 VIEW COMPARISON:  None. FINDINGS:  Endotracheal tube is in place with the tip in good position just below the clavicular heads. NG tube tip is in the fundus of the stomach. There are bilateral pulmonary opacities, worse on the right. No pneumothorax or pleural effusion. Heart size is normal. IMPRESSION: ETT and NG tube in good position. Right greater than left perihilar opacities could be due to pneumonia or edema. Electronically Signed   By: Drusilla Kanner M.D.   On: 06/15/2021 14:16   CT Maxillofacial Wo Contrast  Result Date: 06/15/2021 CLINICAL DATA:  Found on the ground.  Intoxicated. EXAM: CT HEAD WITHOUT CONTRAST CT MAXILLOFACIAL WITHOUT CONTRAST CT CERVICAL SPINE WITHOUT CONTRAST TECHNIQUE: Multidetector CT imaging of the head, cervical spine, and maxillofacial structures were performed using the standard protocol without intravenous contrast. Multiplanar CT image reconstructions of the cervical spine and maxillofacial structures were also generated. COMPARISON:  None. FINDINGS: CT HEAD FINDINGS Brain: The brain shows a normal appearance without  evidence of malformation, atrophy, old or acute small or large vessel infarction, mass lesion, hemorrhage, hydrocephalus or extra-axial collection. Vascular: No hyperdense vessel. No evidence of atherosclerotic calcification. Skull: Normal.  No traumatic finding.  No focal bone lesion. Sinuses/Orbits: Sinuses are clear. Orbits appear normal. Mastoids are clear. Other: None significant CT MAXILLOFACIAL FINDINGS Osseous: No evidence of facial fracture. Orbits: No orbital injury. Sinuses: Some mucosal thickening and fluid of the right maxillary sinus. Other sinuses are clear. Soft tissues: No soft tissue face abnormality of significance. Endotracheal tube and orogastric tube in place. CT CERVICAL SPINE FINDINGS Alignment: No malalignment. Skull base and vertebrae: No fracture or focal bone lesion. Soft tissues and spinal canal: No soft tissue neck lesion identified. Endotracheal tube and orogastric tube in place. Disc levels: No degenerative disc disease. No bony stenosis of the canal or foramina. Upper chest: Septal thickening in the upper lungs could reflect early edema. Other: None IMPRESSION: Head CT: Normal Maxillofacial CT: No traumatic finding. Some mucosal inflammatory disease of the right maxillary sinus. Cervical spine CT: No traumatic finding.  Normal. Endotracheal tube and orogastric tube in place. Septal thickening in the upper lungs consistent with pulmonary edema. Electronically Signed   By: Paulina Fusi M.D.   On: 06/15/2021 15:35     Assessment and Plan:    Elevated Trop - unable perform history, called home and sister number (merge chart), no answer - EKG without acute ischemic findings, rate related ST depression  - Hs Trop 2116, trend to peak  - Echo pending, BNP pending, continue telemetry monitor  - suspect demand ischemia, will follow Echo   Acute metabolic encephalopathy  - etiology unclear, hx of drug overdose with opioid and with psychological issues  - pending workup with urine  tox/CK/Bcx/EEG,  consider ammonia level, free T4/T3, MRI of brain  - monitor neuro exam, care per PCCM   Ventilator dependent respiratory failure  - due to encephalopathy - on mechanical ventilation - care per PCCM   Presumed aspiration pneumonia  - chest imaging concern for aspiration pneumonia - monitor for Bcx, antigen study, and fever/hypoxia , consider empiric antibiotic   Hypokalemia  - will check Mag, K 2.6, replete per PCCM   Recurrent hypoglycemia  - consider endocrinology workup rule out AI, insuloma, etc - agree with Dextrose infusion   Leukopenia Hypothermia  Sinus tachycardia  - lactic acid WNL, need infectious work up, will defer to PCCM   Hypoalbuminemia BLE edema  Cachexia -  suspect chronic poor nutrition intake based on clinical exam   Hypothyroidism  Migraine Anxiety/depression GERD  - defer to PCCM    Risk Assessment/Risk Scores:      For questions or updates, please contact [facility] Please consult [email or web address] for contact info under   Signed, Cyndi BenderXika Zhao, NP  06/15/2021 4:15 PM   As above, patient seen and examined.  Briefly she is a 40 year old female with past medical history of migraines, anxiety, depression, hypothyroidism, anemia, restless leg syndrome, opioid abuse, history of vertical sleeve gastrectomy for evaluation of elevated troponin.  Patient is intubated and sedated at time of evaluation.  History is gleaned from medical records.  She was last seen July 5 at 5 PM in her normal state.  Today she was found minimally responsive.  She was found to be hypoglycemic and was given D50 but no improvement in mental status.  She was given Narcan without improvement.  [facility]  Her troponin is elevated and cardiology is asked to evaluate.  Temperature is 96.6. Laboratory showed potassium 2.6, glucose 41, troponin I 2116, [place] blood cell count 2.5.  Sinus tachycardia, right atrial  enlargement, diffuse upsloping ST depression and prolonged QT interval.  Chest x-ray shows right greater than left perihilar opacities.  1 elevated troponin-etiology of presentation is unclear.  She was found unresponsive, hypoglycemic and also with metabolic abnormalities.  Drug screen is negative.  She is presently intubated and unresponsive.  I wonder if there may be a component of rhabdomyolysis.  We will check CK.  Continue to cycle troponins.  Schedule echocardiogram to assess LV function.  Further cardiac evaluation pending results of the studies and improvement in mental/overall clinical status. By report LV function is decreased.  2 possible aspiration pneumonia-Antibiotics per critical care medicine.  3 hypokalemia-supplement.  4 hypoglycemia-Per primary service.  5 VDRF/metabolic encephalopathy-per CCM.  Olga MillersBrian Idris Edmundson, MD

## 2021-06-15 NOTE — H&P (Addendum)
NAME:  Sonia Howard, MRN:  [id number], DOB:  1981-09-22, LOS: 0 ADMISSION DATE:  06/15/2021, CONSULTATION DATE: 06/15/2021 REFERRING MD:  Maia Plan, MD , CHIEF COMPLAINT: Found unresponsive  History of Present Illness:  Patient is intubated and unresponsive, most of the history is taken from chart review  40 year old female with history of drug overdose and unintentional weight loss who was brought into the emergency department after she was found unresponsive at her friend's house, her last known well was 5 PM yesterday.  Patient's vital signs were stable per EMS report but she was not responsive, her blood sugar was 23, she was given 2 A of D50 with that blood sugar improved to 250, she was given 2 mg of Narcan with no response.  Initially she received bag valve mask ventilation, in the emergency department she was intubated due to her GCS score being 3, in the emergency department patient blood sugar again was noted to be 12, she received D50 x2 and was started on D10. PCCM was consulted for help with evaluation and management  Pertinent  Medical History  Unintentional weight loss   Significant Hospital Events: Including procedures, antibiotic start and stop dates in addition to other pertinent events   7/6 admitted and intubated  Interim History / Subjective:    Objective   Blood pressure 131/77, pulse (!) 113, resp. rate 18, height 5\' 8"  (1.727 m), weight 60 kg, SpO2 100 %.    Vent Mode: PRVC FiO2 (%):  [100 %] 100 % Set Rate:  [15 bmp] 15 bmp Vt Set:  [430 mL-510 mL] 510 mL PEEP:  [5 cmH20] 5 cmH20 Plateau Pressure:  [20 cmH20] 20 cmH20  No intake or output data in the 24 hours ending 06/15/21 1617 Filed Weights   06/15/21 1600  Weight: 60 kg    Examination:   Physical exam: General: Crtitically ill-appearing cachectic female, orally intubated HEENT: [place]/AT, eyes anicteric.  ETT and OGT in place Neuro: Eyes closed, does not follow commands, pupils 5 mm minimally  reactive to light, positive cough and gag and corneal reflexes.  Minimal flickering movement of lower extremities with painful stimuli Chest: Coarse breath sounds, no wheezes or rhonchi Heart: Regular rate and rhythm, no murmurs or gallops Abdomen: Soft, nontender, nondistended, bowel sounds present Skin: No rash  Bedside echocardiogram was limited: Subxiphoid view showed moderate to severe LV systolic dysfunction and regional wall motion abnormalities  Resolved Hospital Problem list     Assessment & Plan:  Acute metabolic encephalopathy likely in the setting of hypoglycemia Recurrent hypoglycemia, question drug/medicine overdose Hypokalemia with EKG changes Demand cardiac ischemia versus acute NSTEMI Acute hypoxic respiratory failure Aspiration pneumonia Severe protein calorie malnutrition  Patient was found unresponsive and her blood sugar was very low She received multiple doses of D50, started on D10 infusion We will give her 1 dose of octreotide subcu to break the cycle of insulin release and hypoglycemia There is a question patient may have overdosed on insulin or oral hypoglycemic agents Will check sulfonylureas level C-peptide and insulin Aggressively supplement electrolytes Trend troponin and EKGs Cardiology consult is requested Echocardiogram is pending, bedside ultrasonography showed moderate to severely reduced LV systolic function with wall motion abnormalities Continue lung protective ventilation VAP bundle Minimize sedation with RASS goal 0/-1 Will start on Precedex Started on IV Zosyn Follow-up with respiratory and blood cultures and adjust antibiotic accordingly Dietitian consult   Best Practice (right click and "Reselect all SmartList Selections" daily)   Diet/type: NPO DVT  prophylaxis: prophylactic heparin  GI prophylaxis: PPI Lines: N/A Foley:  Yes, and it is still needed Code Status:  full code Last date of multidisciplinary goals of care  discussion [pending]  Labs   CBC: Recent Labs  Lab 06/15/21 1322  WBC 2.5*  NEUTROABS 1.9  HGB 12.9  HCT 42.5  MCV 90.0  PLT 218    Basic Metabolic Panel: Recent Labs  Lab 06/15/21 1322  NA 145  K 2.6*  CL 110  CO2 28  GLUCOSE 41*  BUN 21*  CREATININE 0.80  CALCIUM 7.7*   GFR: Estimated Creatinine Clearance: 88.5 mL/min (by C-G formula based on SCr of 0.8 mg/dL). Recent Labs  Lab 06/15/21 1322  WBC 2.5*  LATICACIDVEN 1.9    Liver Function Tests: Recent Labs  Lab 06/15/21 1322  AST 61*  ALT 45*  ALKPHOS 79  BILITOT 0.5  PROT 5.5*  ALBUMIN 2.3*   No results for input(s): LIPASE, AMYLASE in the last 168 hours. No results for input(s): AMMONIA in the last 168 hours.  ABG No results found for: PHART, PCO2ART, PO2ART, HCO3, TCO2, ACIDBASEDEF, O2SAT   Coagulation Profile: Recent Labs  Lab 06/15/21 1322  INR 1.1    Cardiac Enzymes: No results for input(s): CKTOTAL, CKMB, CKMBINDEX, TROPONINI in the last 168 hours.  HbA1C: No results found for: HGBA1C  CBG: Recent Labs  Lab 06/15/21 1325 06/15/21 1336 06/15/21 1527  GLUCAP 12* 344* 87    Review of Systems:   Unable to obtain as patient is intubated and encephalopathic  Past Medical History:  She,  has no past medical history on file.   Surgical History:  Unable to obtain as patient is intubated and encephalopathic  Social History:     Unable to obtain as patient is intubated and encephalopathic Family History:  Her family history is not on file.   Allergies Not on File   Home Medications  Prior to Admission medications   Not on File    Total critical care time: 49 minutes  Performed by: Cheri Fowler   Critical care time was exclusive of separately billable procedures and treating other patients.   Critical care was necessary to treat or prevent imminent or life-threatening deterioration.   Critical care was time spent personally by me on the following activities:  development of treatment plan with patient and/or surrogate as well as nursing, discussions with consultants, evaluation of patient's response to treatment, examination of patient, obtaining history from patient or surrogate, ordering and performing treatments and interventions, ordering and review of laboratory studies, ordering and review of radiographic studies, pulse oximetry and re-evaluation of patient's condition.   Cheri Fowler MD [facility] See Amion for pager If no response to pager, please call [phone] until 7pm After 7pm, Please call E-link [phone]

## 2021-06-16 ENCOUNTER — Inpatient Hospital Stay (HOSPITAL_COMMUNITY): Payer: Medicaid Other

## 2021-06-16 DIAGNOSIS — R9431 Abnormal electrocardiogram [ECG] [EKG]: Secondary | ICD-10-CM

## 2021-06-16 DIAGNOSIS — L8990 Pressure ulcer of unspecified site, unspecified stage: Secondary | ICD-10-CM | POA: Insufficient documentation

## 2021-06-16 LAB — AMMONIA: Ammonia: 28 umol/L (ref 9–35)

## 2021-06-16 LAB — BASIC METABOLIC PANEL
Anion gap: 4 — ABNORMAL LOW (ref 5–15)
BUN: 17 mg/dL (ref 6–20)
CO2: 24 mmol/L (ref 22–32)
Calcium: 7.6 mg/dL — ABNORMAL LOW (ref 8.9–10.3)
Chloride: 112 mmol/L — ABNORMAL HIGH (ref 98–111)
Creatinine, Ser: 0.75 mg/dL (ref 0.44–1.00)
GFR, Estimated: >60 mL/min (ref >60)
Glucose, Bld: 111 mg/dL — ABNORMAL HIGH (ref 70–99)
Potassium: 4.4 mmol/L (ref 3.5–5.1)
Sodium: 140 mmol/L (ref 135–145)

## 2021-06-16 LAB — POCT I-STAT 7, (LYTES, BLD GAS, ICA,H+H)
Acid-base deficit: 1 mmol/L (ref 0.0–2.0)
Bicarbonate: 24.3 mmol/L (ref 20.0–28.0)
Calcium, Ion: 1.14 mmol/L — ABNORMAL LOW (ref 1.15–1.40)
HCT: 33 % — ABNORMAL LOW (ref 36.0–46.0)
Hemoglobin: 11.2 g/dL — ABNORMAL LOW (ref 12.0–15.0)
O2 Saturation: 86 %
Patient temperature: 99.6
Potassium: 4.2 mmol/L (ref 3.5–5.1)
Sodium: 139 mmol/L (ref 135–145)
TCO2: 26 mmol/L (ref 22–32)
pCO2 arterial: 42.3 mmHg (ref 32.0–48.0)
pH, Arterial: 7.37 (ref 7.350–7.450)
pO2, Arterial: 55 mmHg — ABNORMAL LOW (ref 83.0–108.0)

## 2021-06-16 LAB — LACTIC ACID, PLASMA: Lactic Acid, Venous: 2.2 mmol/L (ref 0.5–1.9)

## 2021-06-16 LAB — GLUCOSE, CAPILLARY
Glucose-Capillary: 119 mg/dL — ABNORMAL HIGH (ref 70–99)
Glucose-Capillary: 128 mg/dL — ABNORMAL HIGH (ref 70–99)
Glucose-Capillary: 132 mg/dL — ABNORMAL HIGH (ref 70–99)
Glucose-Capillary: 148 mg/dL — ABNORMAL HIGH (ref 70–99)
Glucose-Capillary: 151 mg/dL — ABNORMAL HIGH (ref 70–99)
Glucose-Capillary: 153 mg/dL — ABNORMAL HIGH (ref 70–99)
Glucose-Capillary: 180 mg/dL — ABNORMAL HIGH (ref 70–99)

## 2021-06-16 LAB — TROPONIN I (HIGH SENSITIVITY)
Troponin I (High Sensitivity): 2252 ng/L (ref <18)
Troponin I (High Sensitivity): 1413 ng/L (ref <18)

## 2021-06-16 LAB — ECHOCARDIOGRAM COMPLETE
Height: 68 in
S' Lateral: 4.5 cm
Weight: 1858.92 oz

## 2021-06-16 LAB — MAGNESIUM
Magnesium: 1.7 mg/dL (ref 1.7–2.4)
Magnesium: 2 mg/dL (ref 1.7–2.4)
Magnesium: 2.4 mg/dL (ref 1.7–2.4)

## 2021-06-16 LAB — PHOSPHORUS
Phosphorus: 2.6 mg/dL (ref 2.5–4.6)
Phosphorus: 2.7 mg/dL (ref 2.5–4.6)
Phosphorus: 2.6 mg/dL (ref 2.5–4.6)

## 2021-06-16 LAB — HEPARIN LEVEL (UNFRACTIONATED): Heparin Unfractionated: 0.1 IU/mL — ABNORMAL LOW (ref 0.30–0.70)

## 2021-06-16 LAB — FOLATE: Folate: 6.6 ng/mL (ref >5.9)

## 2021-06-16 LAB — CBC
HCT: 40.7 % (ref 36.0–46.0)
Hemoglobin: 12.6 g/dL (ref 12.0–15.0)
MCH: 27.2 pg (ref 26.0–34.0)
MCHC: 31 g/dL (ref 30.0–36.0)
MCV: 87.9 fL (ref 80.0–100.0)
Platelets: 190 10*3/uL (ref 150–400)
RBC: 4.63 MIL/uL (ref 3.87–5.11)
RDW: 22.5 % — ABNORMAL HIGH (ref 11.5–15.5)
WBC: 1.9 10*3/uL — ABNORMAL LOW (ref 4.0–10.5)
nRBC: 0 % (ref 0.0–0.2)

## 2021-06-16 LAB — HIV ANTIBODY (ROUTINE TESTING W REFLEX)
HIV Screen 4th Generation wRfx: NONREACTIVE
HIV Screen 4th Generation wRfx: NONREACTIVE

## 2021-06-16 LAB — CK: Total CK: 546 U/L — ABNORMAL HIGH (ref 38–234)

## 2021-06-16 LAB — VITAMIN B12: Vitamin B-12: 304 pg/mL (ref 180–914)

## 2021-06-16 LAB — BRAIN NATRIURETIC PEPTIDE: B Natriuretic Peptide: 1055.3 pg/mL — ABNORMAL HIGH (ref 0.0–100.0)

## 2021-06-16 LAB — TSH: TSH: 0.775 u[IU]/mL (ref 0.350–4.500)

## 2021-06-16 LAB — VITAMIN D 25 HYDROXY (VIT D DEFICIENCY, FRACTURES): Vit D, 25-Hydroxy: 48.47 ng/mL (ref 30–100)

## 2021-06-16 LAB — TRIGLYCERIDES: Triglycerides: 20 mg/dL (ref <150)

## 2021-06-16 IMAGING — MR MR HEAD W/O CM
12 of 13 series · 44 of 48 positions shown · non-contrast
Comparison: CT head from S-shaped.

CLINICAL DATA: Found unresponsive.

EXAM:
MRI HEAD WITHOUT CONTRAST
TECHNIQUE: Multiplanar, multiecho pulse sequences of the brain and surrounding
structures were obtained without intravenous contrast.

[Series 5: DWI · axial · 3.0mm · 0.88mm/px · z∈[-101,+50]mm · 7 of 104 slices shown (1 of 4)]
[im 1/104]
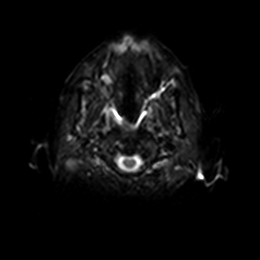
[im 18/104]
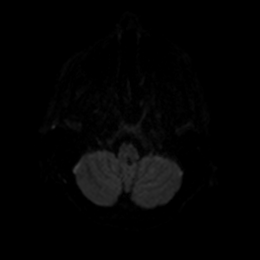
[im 35/104]
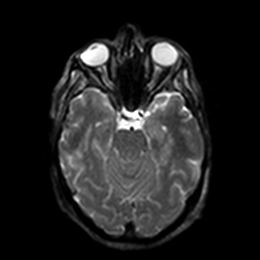
[im 52/104]
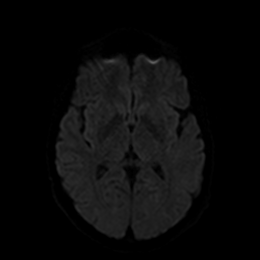
[im 69/104]
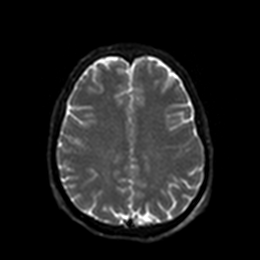
[im 86/104]
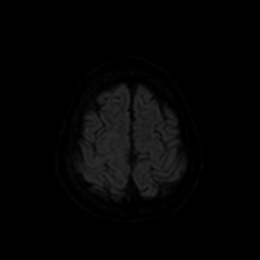
[im 104/104]
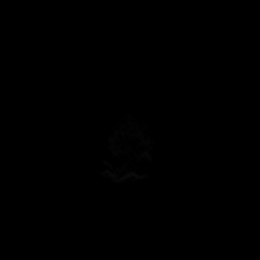

[Series 6: DWI · axial · 3.0mm · 0.88mm/px · z∈[-101,+50]mm · 4 of 50 slices shown (2 of 4)]
[im 1/50]
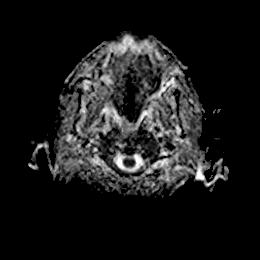
[im 17/50]
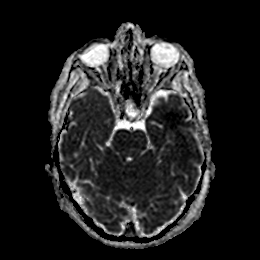
[im 33/50]
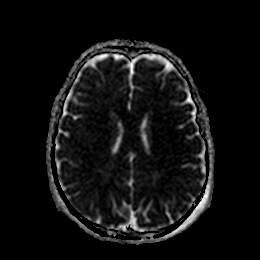
[im 50/50]
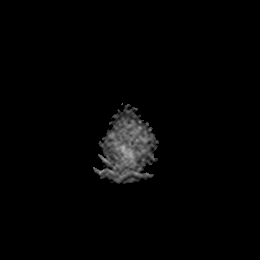

[Series 7: DWI · coronal · 4.0mm · 0.88mm/px · 6 of 76 slices shown (3 of 4)]
[im 1/76]
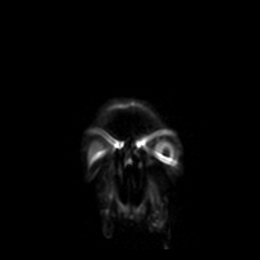
[im 16/76]
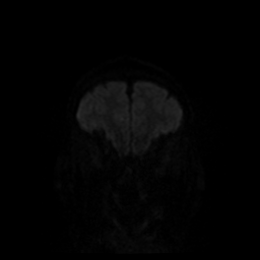
[im 31/76]
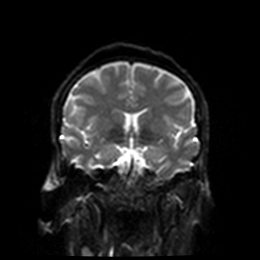
[im 46/76]
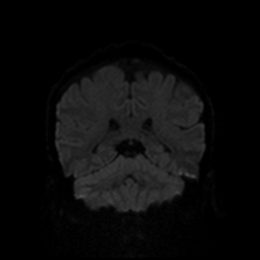
[im 61/76]
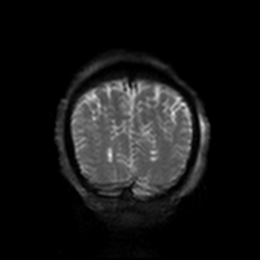
[im 76/76]
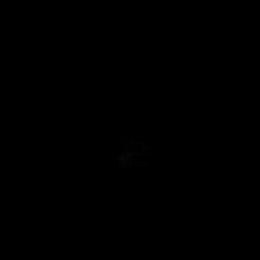

[Series 8: DWI · coronal · 4.0mm · 0.88mm/px · 3 of 38 slices shown (4 of 4)]
[im 1/38]
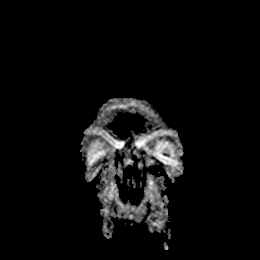
[im 19/38]
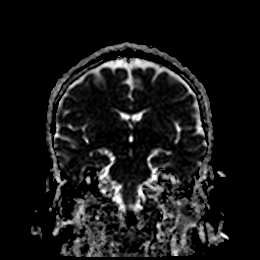
[im 38/38]
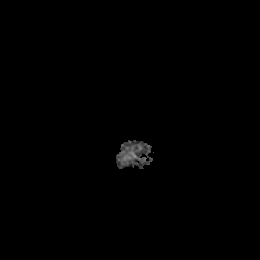

[Series 9: T1 · sagittal · 5.0mm · 0.75mm/px · 2 of 25 slices shown]
[im 1/25]
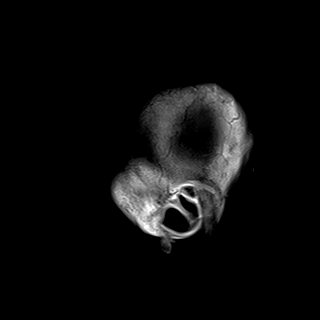
[im 25/25]
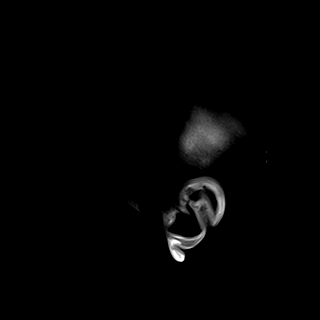

[Series 10: FLAIR · axial · 5.0mm · 0.90mm/px · z∈[-98,+51]mm · 2 of 26 slices shown]
[im 1/26]
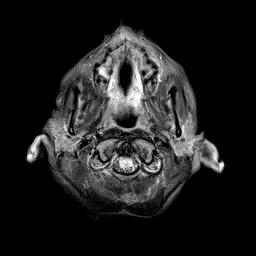
[im 26/26]
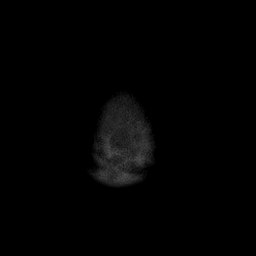

[Series 11: mag_images · axial · 3.0mm · 0.90mm/px · z∈[-105,+59]mm · 4 of 56 slices shown]
[im 1/56]
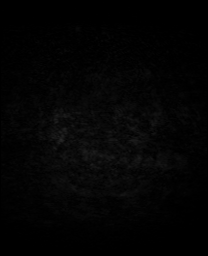
[im 19/56]
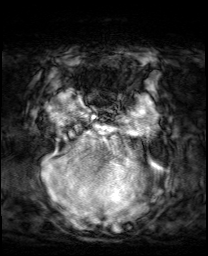
[im 37/56]
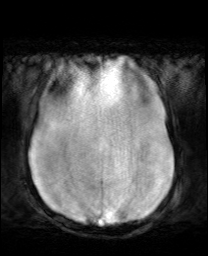
[im 56/56]
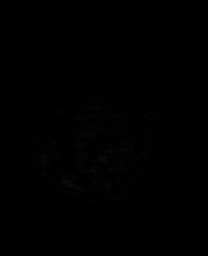

[Series 12: pha_images · axial · 3.0mm · 0.90mm/px · z∈[-99,+59]mm · 4 of 49 slices shown]
[im 1/49]
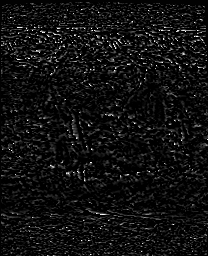
[im 17/49]
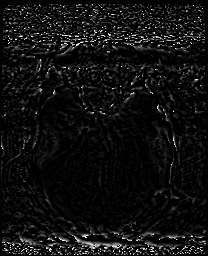
[im 33/49]
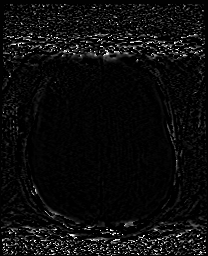
[im 49/49]
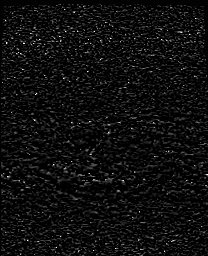

[Series 13: swi_images · axial · 3.0mm · 0.90mm/px · z∈[-105,+59]mm · 4 of 56 slices shown]
[im 1/56]
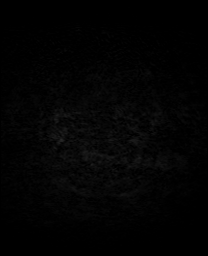
[im 19/56]
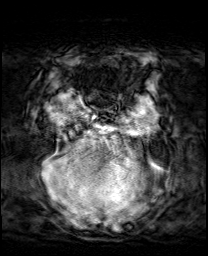
[im 37/56]
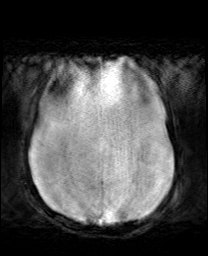
[im 56/56]
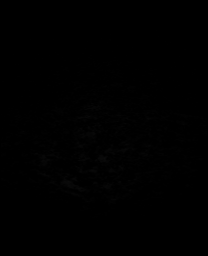

[Series 14: mip_images(sw) · axial · 24.0mm · 0.90mm/px · z∈[-94,+49]mm · 4 of 49 slices shown]
[im 1/49]
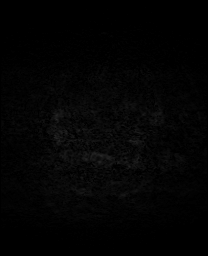
[im 17/49]
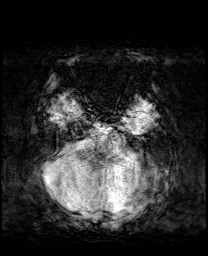
[im 33/49]
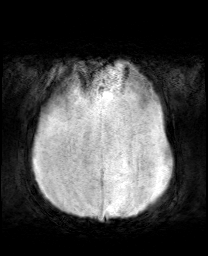
[im 49/49]
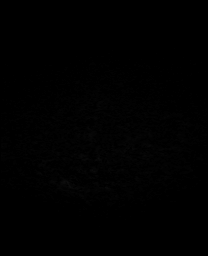

[Series 15: T2 · axial · 5.0mm · 0.72mm/px · z∈[-98,+51]mm · 2 of 26 slices shown (1 of 2)]
[im 1/26]
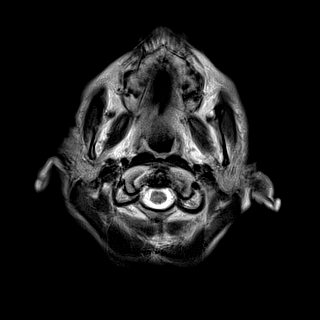
[im 26/26]
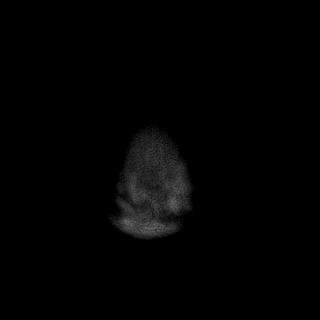

[Series 17: T2 · coronal · 5.0mm · 0.72mm/px · 2 of 30 slices shown (2 of 2)]
[im 1/30]
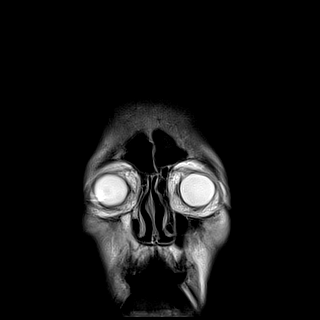
[im 30/30]
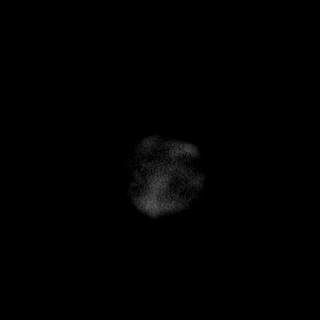

[44 of 48 positions shown; findings below may reference images not displayed]

FINDINGS: Motion limited evaluation.  Within this limitation:

Brain: No acute infarction, hemorrhage, hydrocephalus, extra-axial
collection or mass lesion.

Vascular: Major arterial flow voids are maintained at the skull
base.

Skull and upper cervical spine: No focal marrow replacing lesion.

Sinuses/Orbits: Right maxillary sinus and right sphenoid sinus
mucosal thickening. No acute orbital findings.

Other: Trace bilateral mastoid fluid.
IMPRESSION: No evidence of acute intracranial abnormality on this motion limited
exam.

## 2021-06-16 IMAGING — DX DG CHEST 1V PORT
1 series · 1 of 1 positions shown · non-contrast
Comparison: [DATE].

CLINICAL DATA: Acute hypoxic respiratory failure.

EXAM:
PORTABLE CHEST 1 VIEW

[chest ap]
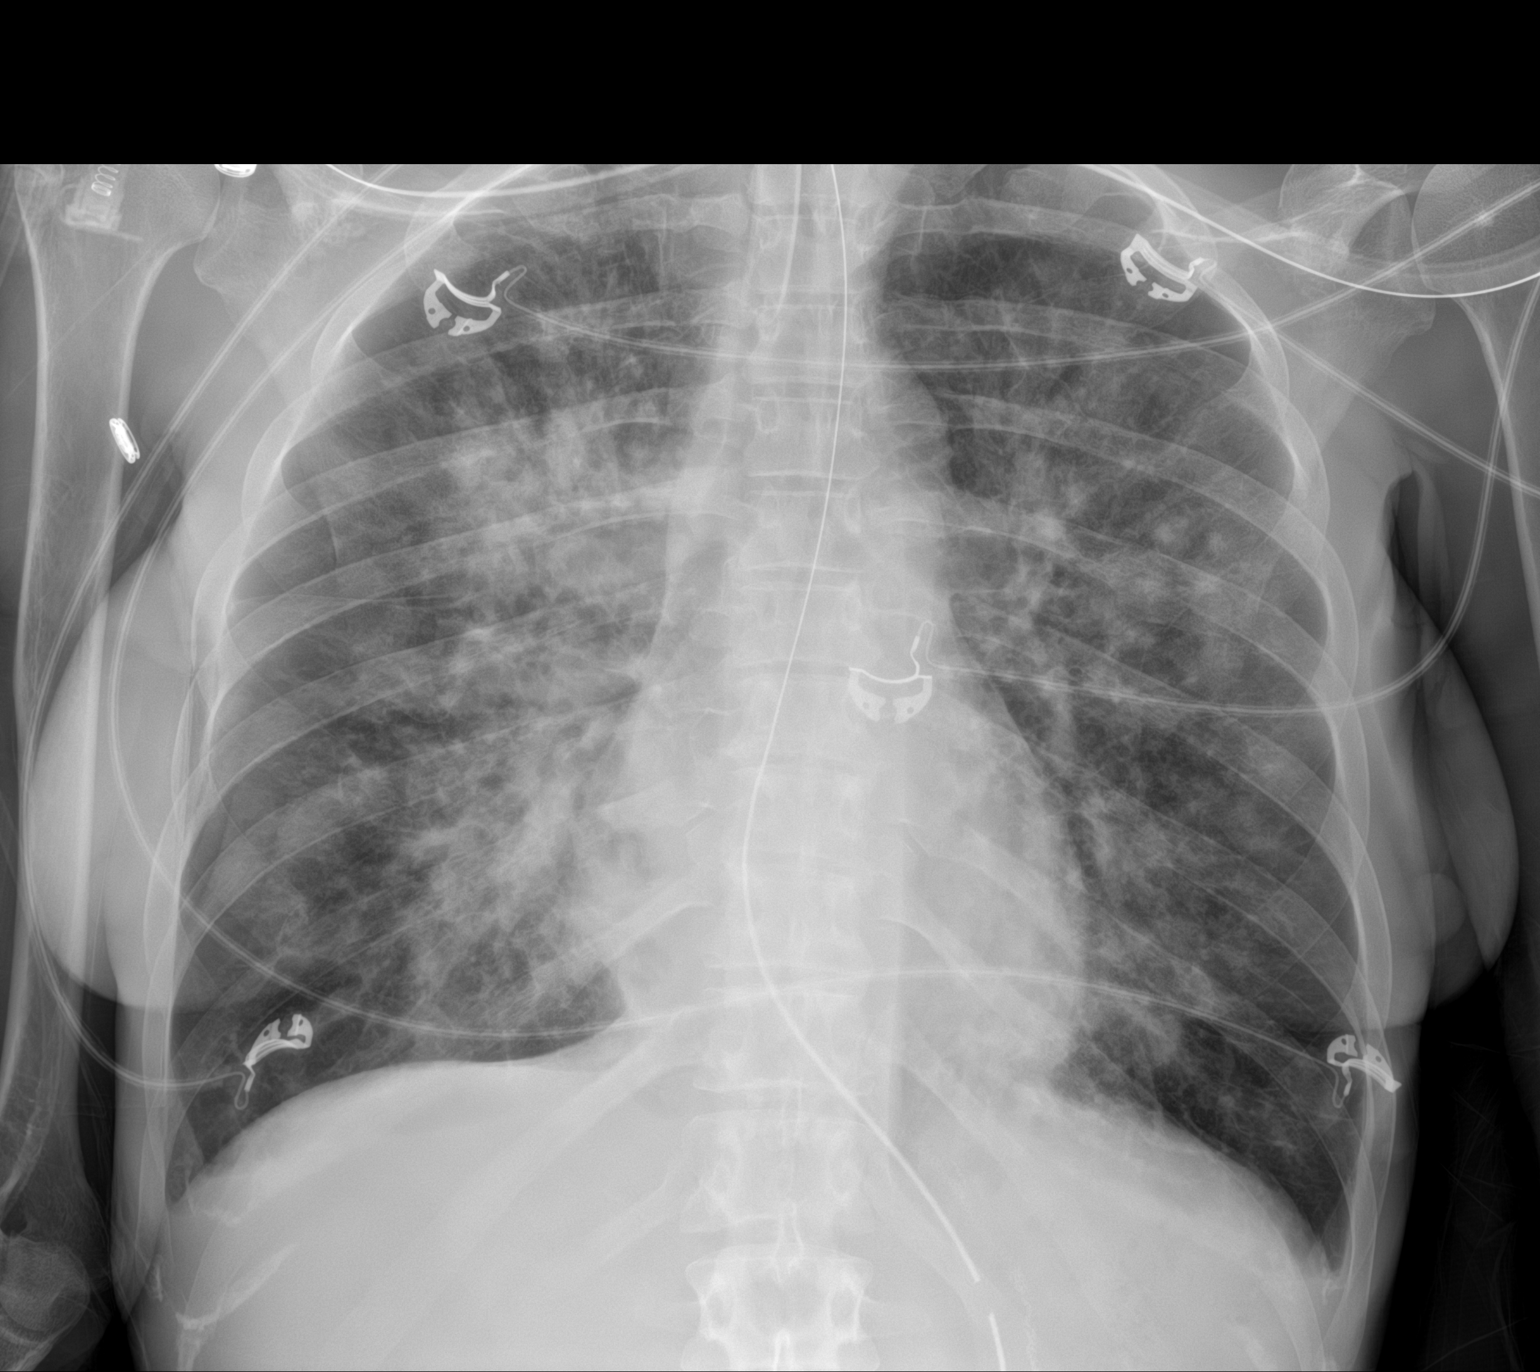

[1 of 1 positions shown; findings below may reference images not displayed]

FINDINGS: Endotracheal tube tip is approximately 3.8 cm above the carina.
Gastric tube courses below the diaphragm with the tip outside the
field of view and the side port below the GE junction. Mildly
improved right greater than left perihilar airspace opacities.
Suspected small bilateral pleural effusions with blunting of
bilateral costophrenic sulci. No visible pneumothorax on this single
semi erect radiograph. Right-sided skin fold. Cardiomediastinal
silhouette is within normal limits.
IMPRESSION: 1. Mildly improved right greater than left perihilar airspace
opacities, which could represent aspiration, pneumonia, and/or
asymmetric edema.
2. Suspected small bilateral pleural effusions.

## 2021-06-16 IMAGING — DX DG CHEST 1V
1 series · 1 of 1 positions shown · non-contrast
Comparison: Earlier same day

CLINICAL DATA: Central line placement

EXAM:
CHEST  1 VIEW

[chest ap]
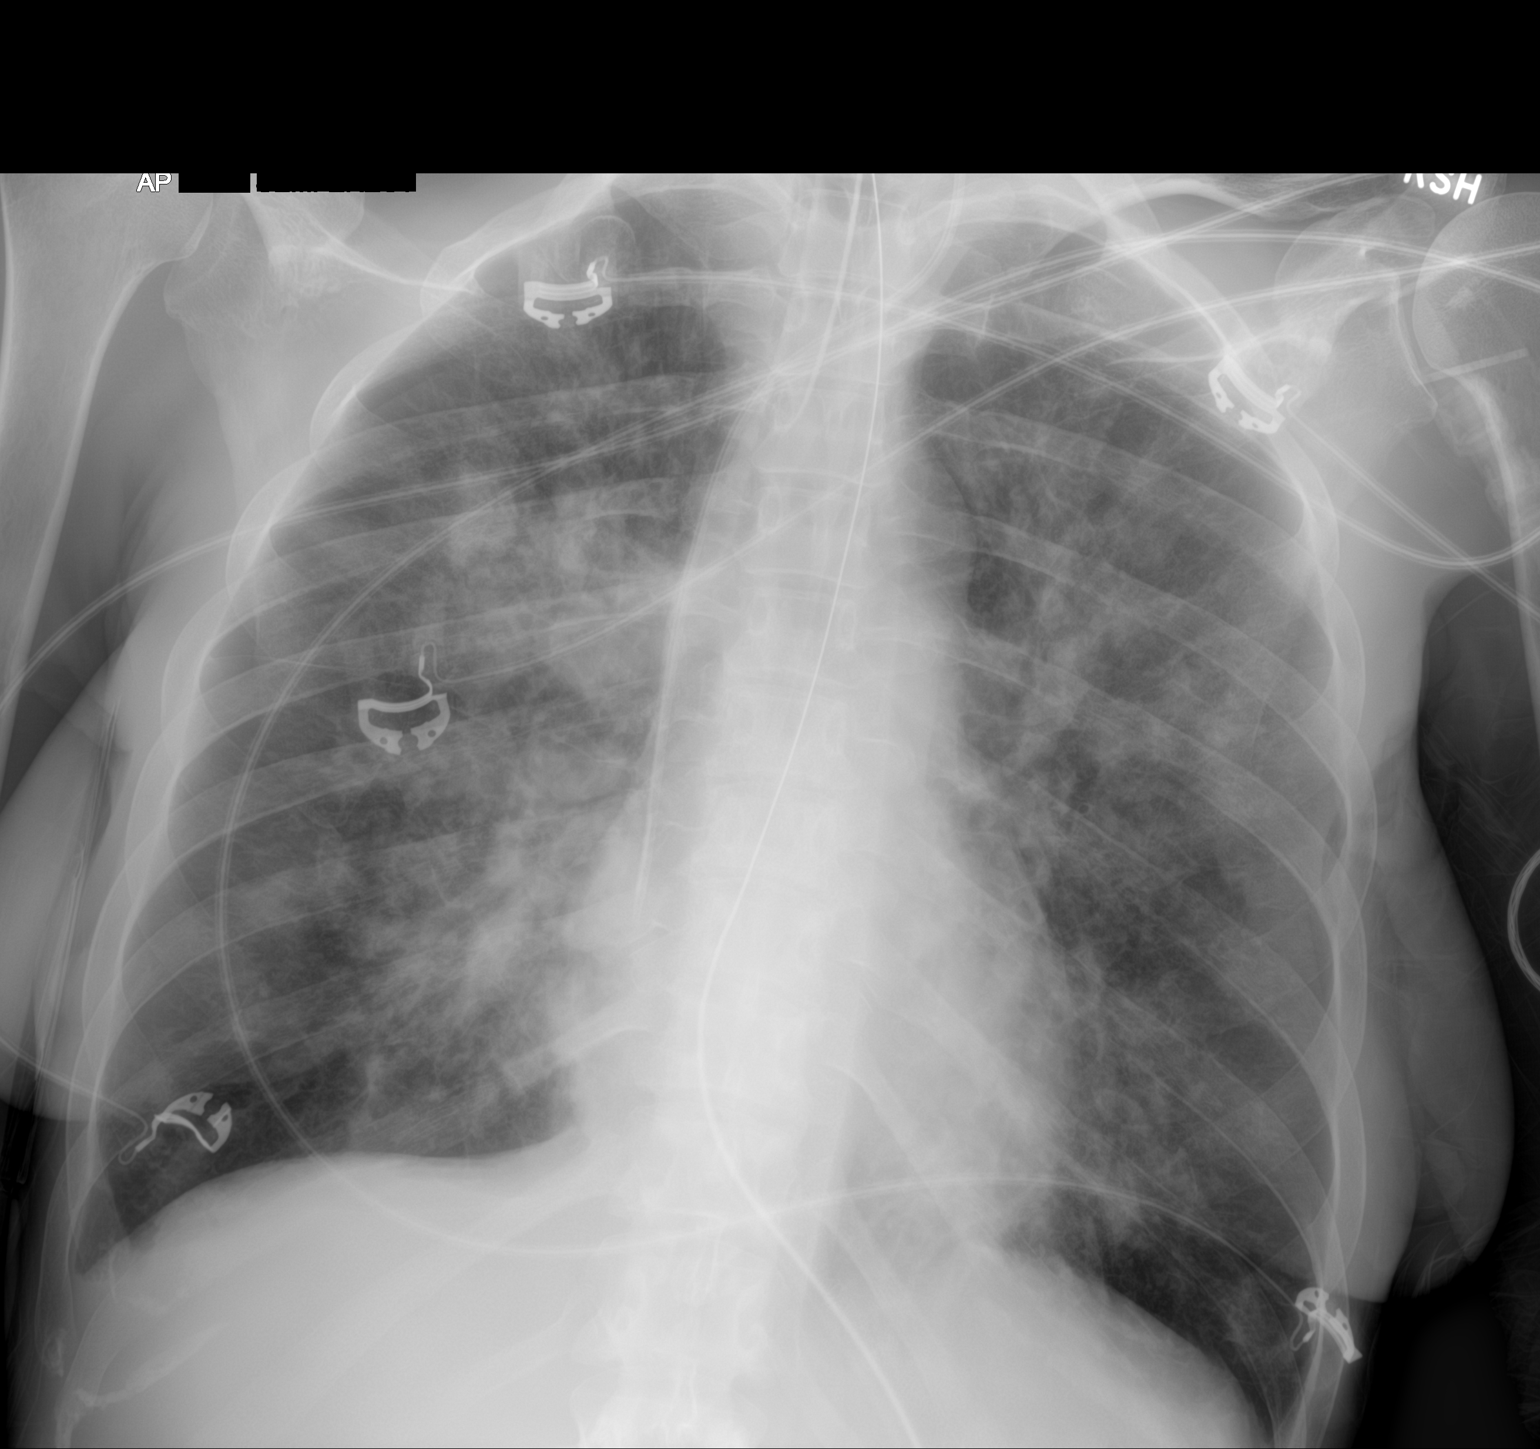

[1 of 1 positions shown; findings below may reference images not displayed]

FINDINGS: New left IJ central line tip overlies the superior right atrium.
Enteric tube passes into the stomach with tip out of field of view.
Endotracheal tube is unchanged.

Persistent bilateral opacities with relative sparing of the
periphery. Aeration is similar. No significant pleural effusion. No
pneumothorax. Stable cardiomediastinal contours.
IMPRESSION: New right IJ line tip overlies superior right atrium. No
pneumothorax.

Similar lung aeration with persistent bilateral opacities.

## 2021-06-16 MED ORDER — VITAL HIGH PROTEIN PO LIQD: 1000.0000 mL | ORAL | Status: DC

## 2021-06-16 MED ORDER — VITAL 1.5 CAL PO LIQD
1000.0000 mL | ORAL | Status: DC
  Administered 2021-06-16 – 2021-06-21 (×5): 1000 mL
  Filled 2021-06-16 (×7): qty 1000

## 2021-06-16 MED ORDER — SODIUM CHLORIDE 0.9 % IV SOLN: 2.0000 g | Freq: Three times a day (TID) | INTRAVENOUS | Status: DC

## 2021-06-16 MED ORDER — LACTATED RINGERS IV BOLUS
500.0000 mL | Freq: Once | INTRAVENOUS | Status: AC
  Administered 2021-06-16: 500 mL via INTRAVENOUS

## 2021-06-16 MED ORDER — ASPIRIN EC 81 MG PO TBEC: 81.0000 mg | DELAYED_RELEASE_TABLET | Freq: Every day | ORAL | Status: DC

## 2021-06-16 MED ORDER — POLYETHYLENE GLYCOL 3350 17 G PO PACK: 17.0000 g | PACK | Freq: Every day | ORAL | Status: DC | PRN

## 2021-06-16 MED ORDER — HEPARIN BOLUS VIA INFUSION
1300.0000 [IU] | Freq: Once | INTRAVENOUS | Status: AC
  Administered 2021-06-16: 1300 [IU] via INTRAVENOUS
  Filled 2021-06-16: qty 1300

## 2021-06-16 MED ORDER — DOCUSATE SODIUM 50 MG/5ML PO LIQD
100.0000 mg | Freq: Two times a day (BID) | ORAL | Status: DC | PRN
  Administered 2021-06-20: 100 mg

## 2021-06-16 MED ORDER — HEPARIN (PORCINE) 25000 UT/250ML-% IV SOLN
950.0000 [IU]/h | INTRAVENOUS | Status: DC
  Administered 2021-06-16: 650 [IU]/h via INTRAVENOUS
  Filled 2021-06-16 (×2): qty 250

## 2021-06-16 MED ORDER — MAGNESIUM SULFATE 2 GM/50ML IV SOLN
2.0000 g | Freq: Once | INTRAVENOUS | Status: AC
  Administered 2021-06-16: 2 g via INTRAVENOUS
  Filled 2021-06-16: qty 50

## 2021-06-16 MED ORDER — ATORVASTATIN CALCIUM 10 MG PO TABS
20.0000 mg | ORAL_TABLET | Freq: Every day | ORAL | Status: DC
  Administered 2021-06-17 – 2021-06-20 (×4): 20 mg
  Filled 2021-06-16 (×4): qty 2

## 2021-06-16 MED ORDER — ATORVASTATIN CALCIUM 10 MG PO TABS
20.0000 mg | ORAL_TABLET | Freq: Every day | ORAL | Status: DC
  Administered 2021-06-16: 20 mg via ORAL
  Filled 2021-06-16: qty 2

## 2021-06-16 MED ORDER — THIAMINE HCL 100 MG PO TABS: 500.0000 mg | ORAL_TABLET | Freq: Three times a day (TID) | ORAL | Status: DC

## 2021-06-16 MED ORDER — SODIUM CHLORIDE 0.9 % IV SOLN
250.0000 mL | INTRAVENOUS | Status: DC
  Administered 2021-06-17 – 2021-06-20 (×2): 250 mL via INTRAVENOUS

## 2021-06-16 MED ORDER — PHENYLEPHRINE HCL-NACL 10-0.9 MG/250ML-% IV SOLN: 25.0000 ug/min | INTRAVENOUS | Status: DC

## 2021-06-16 MED ORDER — SODIUM CHLORIDE 0.9 % IV SOLN
2.0000 g | Freq: Two times a day (BID) | INTRAVENOUS | Status: DC
  Administered 2021-06-16 – 2021-06-17 (×2): 2 g via INTRAVENOUS
  Filled 2021-06-16 (×2): qty 20

## 2021-06-16 MED ORDER — ASPIRIN 325 MG PO TABS
325.0000 mg | ORAL_TABLET | Freq: Once | ORAL | Status: AC
  Administered 2021-06-16: 325 mg via ORAL
  Filled 2021-06-16: qty 1

## 2021-06-16 MED ORDER — THIAMINE HCL 100 MG PO TABS: 100.0000 mg | ORAL_TABLET | Freq: Every day | ORAL | Status: DC

## 2021-06-16 MED ORDER — VANCOMYCIN HCL IN DEXTROSE 750-5 MG/150ML-% IV SOLN
750.0000 mg | Freq: Two times a day (BID) | INTRAVENOUS | Status: DC
  Administered 2021-06-16: 750 mg via INTRAVENOUS
  Filled 2021-06-16 (×2): qty 150

## 2021-06-16 MED ORDER — POTASSIUM & SODIUM PHOSPHATES 280-160-250 MG PO PACK
2.0000 | PACK | Freq: Once | ORAL | Status: AC
  Administered 2021-06-16: 2
  Filled 2021-06-16: qty 2

## 2021-06-16 MED ORDER — PROSOURCE TF PO LIQD: 45.0000 mL | Freq: Two times a day (BID) | ORAL | Status: DC

## 2021-06-16 MED ORDER — LORAZEPAM 2 MG/ML IJ SOLN
1.0000 mg | Freq: Once | INTRAMUSCULAR | Status: AC
  Administered 2021-06-16: 1 mg via INTRAVENOUS
  Filled 2021-06-16: qty 1

## 2021-06-16 MED ORDER — PROSOURCE TF PO LIQD
45.0000 mL | Freq: Every day | ORAL | Status: DC
  Administered 2021-06-16 – 2021-06-27 (×12): 45 mL
  Filled 2021-06-16 (×13): qty 45

## 2021-06-16 MED ORDER — VANCOMYCIN HCL IN DEXTROSE 1-5 GM/200ML-% IV SOLN
1000.0000 mg | INTRAVENOUS | Status: AC
  Administered 2021-06-16: 1000 mg via INTRAVENOUS
  Filled 2021-06-16: qty 200

## 2021-06-16 NOTE — Procedures (Signed)
Patient Name: Kathrin Folden  MRN: [id number]  Epilepsy Attending: Charlsie Quest  Referring Physician/Provider: Dr. Levon Hedger Date: 06/16/2021 Duration: 24.43 minutes  Patient history: 40 year old female with history of drug overdose and unintentional weight loss brought in after she was found unresponsive at a friend's home.  Blood sugar was 23.  EEG to evaluate for seizures.  Level of alertness: Comatose  AEDs during EEG study: None  Technical aspects: This EEG study was done with scalp electrodes positioned according to the 10-20 International system of electrode placement. Electrical activity was acquired at a sampling rate of 500Hz  and reviewed with a high frequency filter of 70Hz  and a low frequency filter of 1Hz . EEG data were recorded continuously and digitally stored.   Description: EEG showed continuous generalized 3 to 6 Hz theta-delta slowing. Generalized periodic discharges with triphasic morphology at 1 Hz were also noted.  EEG was reactive to noxious stimulation.  Hyperventilation and photic stimulation were not performed.     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 No seizures or definite epileptiform discharges were seen throughout the recording.  Kalese Ensz 

## 2021-06-16 NOTE — Progress Notes (Signed)
ANTICOAGULATION CONSULT NOTE - Initial Consult  Pharmacy Consult:  Heparin Indication: chest pain/ACS  Not on File  Patient Measurements: Height: 5\' 8"  (172.7 cm) Weight: 52.7 kg (116 lb 2.9 oz) IBW/kg (Calculated) : 63.9 Heparin Dosing Weight: 52 kg  Vital Signs: Temp: 99.6 F (37.6 C) (07/07 0331) Temp Source: Axillary (07/07 0331) BP: 120/82 (07/07 0715) Pulse Rate: 110 (07/07 0715)  Labs: Recent Labs    06/15/21 1322 06/15/21 1621 06/16/21 0034 06/16/21 0331  HGB 12.9 13.9 12.6 11.2*  HCT 42.5 41.0 40.7 33.0*  PLT 218  --  190  --   LABPROT 13.7  --   --   --   INR 1.1  --   --   --   CREATININE 0.80  --  0.75  --   CKTOTAL  --   --  546*  --   TROPONINIHS 2,116*  --  2,252*  --     Estimated Creatinine Clearance: 77.8 mL/min (by C-G formula based on SCr of 0.75 mg/dL).   Medical History: No past medical history on file.  Assessment: 39 YOF found unresponsive with severe hypoglycemia and elevated troponin.  Pharmacy consulted to start IV heparin for rule out ACS.  Patient received heparin SQ this AM.  No bleeding reported.  Goal of Therapy:  Heparin level 0.3-0.7 units/ml Monitor platelets by anticoagulation protocol: Yes   Plan:  D/C heparin SQ Heparin infusion at 650 units/hr - no bolus with recent heparin SQ administration Check 6 hr heparin level Daily heparin level and CBC  Larra Crunkleton D. 06-13-2006, PharmD, BCPS, BCCCP 06/16/2021, 7:42 AM

## 2021-06-16 NOTE — Progress Notes (Signed)
[facility] ADULT ICU REPLACEMENT PROTOCOL   The patient does apply for the [facility] Adult ICU Electrolyte Replacment Protocol based on the criteria listed below:   1. Is GFR >/= 30 ml/min? Yes.    Patient's GFR today is >60 2. Is SCr </= 2? Yes.   Patient's SCr is 0.75 ml/kg/hr 3. Did SCr increase >/= 0.5 in 24 hours? No. 4. Abnormal electrolyte(s): mag (1.7),  (),  () 5. Ordered repletion with: protocol 6. If a panic level lab has been reported, has the CCM MD in charge been notified? No..   Physician:    Markus Daft A 06/16/2021 2:23 AM

## 2021-06-16 NOTE — Progress Notes (Signed)
eLink Physician-Brief Progress Note Patient Name: Sonia Howard DOB: 1981/07/05 MRN: [id number]   Date of Service  06/16/2021  HPI/Events of Note  Patient's blood pressure remained soft after 500 ml iv fluid  bolus.  eICU Interventions  LR 500 ml iv bolus repeated x 1, peripheral Phenylephrine ordered.     Intervention Category Major Interventions: Hypotension - evaluation and management  Migdalia Dk 06/16/2021, 2:44 AM

## 2021-06-16 NOTE — Progress Notes (Signed)
eLink Physician-Brief Progress Note Patient Name: Sonia Howard DOB: [birth year] MRN: [id number]   Date of Service  06/16/2021  HPI/Events of Note  AbG results noted.  eICU Interventions  PEEP increased to 10.        [name] 06/16/2021, 3:58 AM

## 2021-06-16 NOTE — Progress Notes (Signed)
Marginal BP 93/59 depsite levo @ 8. D/w Dr Warrick Parisian and ordered a 500cc LR fluid challenge per Dr Jaynie Crumble request

## 2021-06-16 NOTE — Progress Notes (Signed)
NAME:  Sonia Howard, MRN:  [id number], DOB:  12/30/1980, LOS: 1 ADMISSION DATE:  06/15/2021, CONSULTATION DATE: 06/15/2021 REFERRING MD:  Maia Plan, MD , CHIEF COMPLAINT: Found unresponsive  History of Present Illness:  Patient is intubated and unresponsive, most of the history is taken from chart review  40 year old female with history of drug overdose and unintentional weight loss who was brought into the emergency department after she was found unresponsive at her friend's house, her last known well was 5 PM yesterday.  Patient's vital signs were stable per EMS report but she was not responsive, her blood sugar was 23, she was given 2 A of D50 with that blood sugar improved to 250, she was given 2 mg of Narcan with no response.  Initially she received bag valve mask ventilation, in the emergency department she was intubated due to her GCS score being 3, in the emergency department patient blood sugar again was noted to be 12, she received D50 x2 and was started on D10. PCCM was consulted for help with evaluation and management  Pertinent  Medical History  Unintentional weight loss   Significant Hospital Events: Including procedures, antibiotic start and stop dates in addition to other pertinent events   7/6 admitted and intubated  Interim History / Subjective:  Remains comatose.  Objective   Blood pressure 112/79, pulse (!) 107, temperature 99.6 F (37.6 C), temperature source Axillary, resp. rate (!) 21, height 5\' 8"  (1.727 m), weight 52.7 kg, SpO2 97 %.    Vent Mode: PRVC FiO2 (%):  [60 %-100 %] 60 % Set Rate:  [15 bmp-18 bmp] 18 bmp Vt Set:  [430 mL-510 mL] 510 mL PEEP:  [5 cmH20-10 cmH20] 10 cmH20 Plateau Pressure:  [20 cmH20-25 cmH20] 22 cmH20   Intake/Output Summary (Last 24 hours) at 06/16/2021 0659 Last data filed at 06/16/2021 0600 Gross per 24 hour  Intake 2961.46 ml  Output 1850 ml  Net 1111.46 ml   Filed Weights   06/15/21 1600 06/16/21 0451  Weight: 60 kg  52.7 kg    Examination: Poorly responsive Massively dilated pupils poorly responsive Bedside echo EF down in stress pattern, valves thickened, await formal echo Agonally breathing on vent GCS3 CXR with impressive bilateral infiltrates with almost nodular appearance  Resolved Hospital Problem list     Assessment & Plan:  Coma- exam highly concerning for profound neurological injury.  Found hypoglycemic, initial CT head neg. Developing shock, troponin leak, abnormal bedside echo- await formal echo, will give ASA/statin/heparin drip; suspicion here is primarily distributive/septic Abnormal CXR- would not be surprised if these are septic pulmonary emboli, edema also possible Acute hypoxemic respiratory failure- due to bilateral airspace disease Hx IVDA Leukopenia  Several clinical scenarios could fit above picture: overdose with aspiration and stress cardiomyopathy, endocarditis, acute AIDS with PJP PNA.  Awaiting further workup.  Today's plan: - Central line, titrate levophed to MAP 65 - Start TF, wean D5 - Sedation PRN only - MRI brain then EEG, ,may need neuro to see - f/u HIV - Switch abx from zosyn to vanc/cefepime, await culture maturity; add bactrim if HIV+ - ASA/statin/heparin pending echo - I do not think there is going to be a good outcome here  Best Practice (right click and "Reselect all SmartList Selections" daily)   Diet/type: TF DVT prophylaxis: heparin gtt GI prophylaxis: PPI Lines: foley, central line Code Status:  full code Last date of multidisciplinary goals of care discussion- updated brother over phone regarding guarded prognosis, seems realistic  45 min CC time independent of procedures Myrla Halsted MD PCCM

## 2021-06-16 NOTE — Progress Notes (Addendum)
Initial Nutrition Assessment  DOCUMENTATION CODES:   Underweight, Severe malnutrition in context of social or environmental circumstances  INTERVENTION:   - Recommend exchanging OG tube for post-pyloric Cortrak tube tomorrow (7/08) given history of bariatric surgery  Initiate tube feeds via OG tube: - Start Vital 1.5 @ 20 ml/hr and advance by 10 ml q 8 hours to goal rate of 50 ml/hr (1200 ml/day) - ProSource TF 45 ml daily  Tube feeding regimen at goal provides 1840 kcal, 92 grams of protein, and 917 ml of H2O.  Monitor magnesium, potassium, and phosphorus twice daily for at least 3 days, MD to replete as needed, as pt is at risk for refeeding syndrome given severe malnutrition, reports of unintentional weight loss.  - Recommend MVI with minerals BID per tube and calcium carbonate 500 mg TID per tube  - Ordered vitamin C, vitamin D, thiamine, folate, and zinc labs; recommend supplementing if deficiency is present  NUTRITION DIAGNOSIS:   Severe Malnutrition related to social / environmental circumstances (polysubstance abuse) as evidenced by severe muscle depletion, severe fat depletion.  GOAL:   Patient will meet greater than or equal to 90% of their needs  MONITOR:   Vent status, Labs, Weight trends, TF tolerance, Skin, I & O's  REASON FOR ASSESSMENT:   Ventilator, Consult Enteral/tube feeding initiation and management  ASSESSMENT:   40 year old female who presented to the ED on 7/06 after being round unresponsive with very low blood sugar. PMH of IVDA, migraine, anxiety, depression, hypothyroidism, iron deficiency anemia, GERD, hx of overdose with opioid, tobacco abuse, sleeve gastrectomy in 2015. Pt required intubation.  Discussed pt with RN and during ICU rounds. Per CCM, exam is highly concerning for profound neurologic injury. Plan is for MRI of the head. Pt also with developing shock, troponin leak, and abnormal bedside echo.  Received consult for tube feeding  initiation and management. Noted pt with an episode of emesis overnight after medication administration. Pt with OG tube in stomach per x-ray. Will start tube feeds at trickle rate and slowly advance to goal. Discussed plan with RN.  Pt with a history of gastric sleeve in 2015. Per review of notes, family has been reporting pt with recent unintentional weight loss. No weight history available in chart and no family present so unable to obtain diet and weight history at this time. Pt with edema to BLE so unsure of dry weight at this time. Recommend checking vitamin C, vitamin D, zinc, and thiamine labs. Discussed with MD who agreed. Will monitor for deficiencies and recommend supplementation if present.  Admit weight: 60 kg Current weight: 52.7 kg  Patient is currently intubated on ventilator support MV: 17.1 L/min Temp (24hrs), Avg:97.6 F (36.4 C), Min:95.9 F (35.5 C), Max:99.6 F (37.6 C)  Drips: Precedex Heparin Levophed: off this AM D5: 50 ml/hr  Medications reviewed and include: colace, protonix, miralax, phos-nak 2 packets once, IV abx, IV magnesium sulfate 2 grams once  Labs reviewed: ionized calcium 1.14, lactic acid 2.2 CBG's: 119-128 x 12 hours  UOP: 1850 ml x 24 hours I/O's: +1.2 L since admit  NUTRITION - FOCUSED PHYSICAL EXAM:  Flowsheet Row Most Recent Value  Orbital Region Severe depletion  Upper Arm Region Severe depletion  Thoracic and Lumbar Region Severe depletion  Buccal Region Unable to assess  Temple Region Severe depletion  Clavicle Bone Region Severe depletion  Clavicle and Acromion Bone Region Severe depletion  Scapular Bone Region Unable to assess  Dorsal Hand Severe depletion  Patellar  Region Moderate depletion  Anterior Thigh Region Severe depletion  Posterior Calf Region Moderate depletion  [edema likely masking severe depletions]  Edema (RD Assessment) Moderate  [BLE]  Hair Reviewed  Eyes Unable to assess  Mouth Unable to assess  Skin  Reviewed  Nails Reviewed       Diet Order:   Diet Order             Diet NPO time specified  Diet effective now                   EDUCATION NEEDS:   Not appropriate for education at this time  Skin:  Skin Assessment: Skin Integrity Issues: Stage I: coccyx, L ischial tuberosity, R ischial tuberosity  Last BM:  no documented BM  Height:   Ht Readings from Last 1 Encounters:  06/15/21 5\' 8"  (1.727 m)    Weight:   Wt Readings from Last 1 Encounters:  06/16/21 52.7 kg    BMI:  Body mass index is 17.67 kg/m.  Estimated Nutritional Needs:   Kcal:  1750-1950  Protein:  85-100 grams  Fluid:  1.7-1.9 L/day    08/17/21, MS, RD, LDN Inpatient Clinical Dietitian Please see [facility] for contact information.

## 2021-06-16 NOTE — Progress Notes (Signed)
Pt transported to and from MRI on the ventilator without difficulty.

## 2021-06-16 NOTE — Progress Notes (Signed)
  Echocardiogram 2D Echocardiogram has been performed.  Roosvelt Maser F 06/16/2021, 1:10 PM

## 2021-06-16 NOTE — Progress Notes (Addendum)
Progress Note  Patient Name: Sonia Howard Date of Encounter: 06/16/2021  [facility] Cardiologist: New to Dr Stanford Breed  Subjective   Patient remains on ventilator. She withdrawals from painful stimuli, she is off sedation.  She is not opening eyes and non-verbal. She is getting EEG currently. ICU team reports family had present earlier and did report there is drug dealing involvement.   Inpatient Medications    Scheduled Meds:  [START ON 06/17/2021] aspirin EC  81 mg Oral Daily   aspirin  325 mg Oral Once   atorvastatin  20 mg Oral Daily   chlorhexidine gluconate (MEDLINE KIT)  15 mL Mouth Rinse BID   Chlorhexidine Gluconate Cloth  6 each Topical Daily   docusate  100 mg Per Tube BID   feeding supplement (PROSource TF)  45 mL Per Tube Daily   mouth rinse  15 mL Mouth Rinse 10 times per day   pantoprazole sodium  40 mg Per Tube Daily   polyethylene glycol  17 g Per Tube Daily   potassium & sodium phosphates  2 packet Per Tube Once   Continuous Infusions:  sodium chloride Stopped (06/16/21 0259)   sodium chloride     sodium chloride     cefTRIAXone (ROCEPHIN)  IV     dexmedetomidine (PRECEDEX) IV infusion 0.4 mcg/kg/hr (06/16/21 0900)   dextrose 50 mL/hr at 06/16/21 0900   feeding supplement (VITAL 1.5 CAL)     heparin     magnesium sulfate bolus IVPB     norepinephrine (LEVOPHED) Adult infusion 9 mcg/min (06/16/21 0900)   phenylephrine (NEO-SYNEPHRINE) Adult infusion     rocuronium 60 mg/kg/hr (06/15/21 1316)   vancomycin     vancomycin     PRN Meds: docusate sodium, etomidate, fentaNYL (SUBLIMAZE) injection, ondansetron (ZOFRAN) IV, polyethylene glycol, rocuronium   Vital Signs    Vitals:   06/16/21 0742 06/16/21 0749 06/16/21 0800 06/16/21 0900  BP:   122/87 122/86  Pulse:   (!) 112 (!) 110  Resp:  (!) 24 (!) 23 20  Temp: 98.2 F (36.8 C)     TempSrc: Oral     SpO2:  97% 97% 96%  Weight:      Height:        Intake/Output Summary (Last 24 hours) at  06/16/2021 1001 Last data filed at 06/16/2021 0900 Gross per 24 hour  Intake 3689.7 ml  Output 1850 ml  Net 1839.7 ml   Last 3 Weights 06/16/2021 06/15/2021  Weight (lbs) 116 lb 2.9 oz 132 lb 4.4 oz  Weight (kg) 52.7 kg 60 kg      Telemetry    Sinus tachycardia 100s - Personally Reviewed  ECG    N.A this AM - Personally Reviewed  Physical Exam   GEN: Cachetic. Intubated. Off sedation.  Neck: No JVD Cardiac: Tachycardiac, no murmurs, rubs, or gallops.  Respiratory: on mechanical ventilator support GI: Soft, nontender, non-distended  MS: 3+ pitting BLE edema; No deformity. Neuro: Pupils dilated, round and reactive to light, less sluggish today, BLE withdrawals from painful stimuli, no posture or increased tone, off sedation, no spontaneous movement observed, neuro exam is poor. GCS 6 (E1, V1, M4) Psych: unable to assess Left IJ CVL with dressing in place Foley with yellow urine   Labs    High Sensitivity Troponin:   Recent Labs  Lab 06/15/21 1322 06/16/21 0034 06/16/21 0804  TROPONINIHS 2,116* 2,252* 1,413*      Chemistry Recent Labs  Lab 06/15/21 1322 06/15/21 1621 06/16/21 0034  06/16/21 0331  NA 145 146* 140 139  K 2.6* 2.6* 4.4 4.2  CL 110  --  112*  --   CO2 28  --  24  --   GLUCOSE 41*  --  111*  --   BUN 21*  --  17  --   CREATININE 0.80  --  0.75  --   CALCIUM 7.7*  --  7.6*  --   PROT 5.5*  --   --   --   ALBUMIN 2.3*  --   --   --   AST 61*  --   --   --   ALT 45*  --   --   --   ALKPHOS 79  --   --   --   BILITOT 0.5  --   --   --   GFRNONAA >60  --  >60  --   ANIONGAP 7  --  4*  --      Hematology Recent Labs  Lab 06/15/21 1322 06/15/21 1621 06/16/21 0034 06/16/21 0331  WBC 2.5*  --  1.9*  --   RBC 4.72  --  4.63  --   HGB 12.9 13.9 12.6 11.2*  HCT 42.5 41.0 40.7 33.0*  MCV 90.0  --  87.9  --   MCH 27.3  --  27.2  --   MCHC 30.4  --  31.0  --   RDW 22.5*  --  22.5*  --   PLT 218  --  190  --     BNP Recent Labs  Lab  06/16/21 0034  BNP 1,055.3*     DDimer No results for input(s): DDIMER in the last 168 hours.   Radiology    DG Chest 1 View  Result Date: 06/16/2021 CLINICAL DATA:  Central line placement EXAM: CHEST  1 VIEW COMPARISON:  Earlier same day FINDINGS: New left IJ central line tip overlies the superior right atrium. Enteric tube passes into the stomach with tip out of field of view. Endotracheal tube is unchanged. Persistent bilateral opacities with relative sparing of the periphery. Aeration is similar. No significant pleural effusion. No pneumothorax. Stable cardiomediastinal contours. IMPRESSION: New right IJ line tip overlies superior right atrium. No pneumothorax. Similar lung aeration with persistent bilateral opacities. Electronically Signed   By: Macy Mis M.D.   On: 06/16/2021 09:03   CT Head Wo Contrast  Result Date: 06/15/2021 CLINICAL DATA:  Found on the ground.  Intoxicated. EXAM: CT HEAD WITHOUT CONTRAST CT MAXILLOFACIAL WITHOUT CONTRAST CT CERVICAL SPINE WITHOUT CONTRAST TECHNIQUE: Multidetector CT imaging of the head, cervical spine, and maxillofacial structures were performed using the standard protocol without intravenous contrast. Multiplanar CT image reconstructions of the cervical spine and maxillofacial structures were also generated. COMPARISON:  None. FINDINGS: CT HEAD FINDINGS Brain: The brain shows a normal appearance without evidence of malformation, atrophy, old or acute small or large vessel infarction, mass lesion, hemorrhage, hydrocephalus or extra-axial collection. Vascular: No hyperdense vessel. No evidence of atherosclerotic calcification. Skull: Normal.  No traumatic finding.  No focal bone lesion. Sinuses/Orbits: Sinuses are clear. Orbits appear normal. Mastoids are clear. Other: None significant CT MAXILLOFACIAL FINDINGS Osseous: No evidence of facial fracture. Orbits: No orbital injury. Sinuses: Some mucosal thickening and fluid of the right maxillary sinus. Other  sinuses are clear. Soft tissues: No soft tissue face abnormality of significance. Endotracheal tube and orogastric tube in place. CT CERVICAL SPINE FINDINGS Alignment: No malalignment. Skull base and vertebrae: No fracture  or focal bone lesion. Soft tissues and spinal canal: No soft tissue neck lesion identified. Endotracheal tube and orogastric tube in place. Disc levels: No degenerative disc disease. No bony stenosis of the canal or foramina. Upper chest: Septal thickening in the upper lungs could reflect early edema. Other: None IMPRESSION: Head CT: Normal Maxillofacial CT: No traumatic finding. Some mucosal inflammatory disease of the right maxillary sinus. Cervical spine CT: No traumatic finding.  Normal. Endotracheal tube and orogastric tube in place. Septal thickening in the upper lungs consistent with pulmonary edema. Electronically Signed   By: Nelson Chimes M.D.   On: 06/15/2021 15:35   CT Cervical Spine Wo Contrast  Result Date: 06/15/2021 CLINICAL DATA:  Found on the ground.  Intoxicated. EXAM: CT HEAD WITHOUT CONTRAST CT MAXILLOFACIAL WITHOUT CONTRAST CT CERVICAL SPINE WITHOUT CONTRAST TECHNIQUE: Multidetector CT imaging of the head, cervical spine, and maxillofacial structures were performed using the standard protocol without intravenous contrast. Multiplanar CT image reconstructions of the cervical spine and maxillofacial structures were also generated. COMPARISON:  None. FINDINGS: CT HEAD FINDINGS Brain: The brain shows a normal appearance without evidence of malformation, atrophy, old or acute small or large vessel infarction, mass lesion, hemorrhage, hydrocephalus or extra-axial collection. Vascular: No hyperdense vessel. No evidence of atherosclerotic calcification. Skull: Normal.  No traumatic finding.  No focal bone lesion. Sinuses/Orbits: Sinuses are clear. Orbits appear normal. Mastoids are clear. Other: None significant CT MAXILLOFACIAL FINDINGS Osseous: No evidence of facial fracture.  Orbits: No orbital injury. Sinuses: Some mucosal thickening and fluid of the right maxillary sinus. Other sinuses are clear. Soft tissues: No soft tissue face abnormality of significance. Endotracheal tube and orogastric tube in place. CT CERVICAL SPINE FINDINGS Alignment: No malalignment. Skull base and vertebrae: No fracture or focal bone lesion. Soft tissues and spinal canal: No soft tissue neck lesion identified. Endotracheal tube and orogastric tube in place. Disc levels: No degenerative disc disease. No bony stenosis of the canal or foramina. Upper chest: Septal thickening in the upper lungs could reflect early edema. Other: None IMPRESSION: Head CT: Normal Maxillofacial CT: No traumatic finding. Some mucosal inflammatory disease of the right maxillary sinus. Cervical spine CT: No traumatic finding.  Normal. Endotracheal tube and orogastric tube in place. Septal thickening in the upper lungs consistent with pulmonary edema. Electronically Signed   By: Nelson Chimes M.D.   On: 06/15/2021 15:35   DG Chest Port 1 View  Result Date: 06/16/2021 CLINICAL DATA:  Acute hypoxic respiratory failure. EXAM: PORTABLE CHEST 1 VIEW COMPARISON:  June 15, 2021. FINDINGS: Endotracheal tube tip is approximately 3.8 cm above the carina. Gastric tube courses below the diaphragm with the tip outside the field of view and the side port below the GE junction. Mildly improved right greater than left perihilar airspace opacities. Suspected small bilateral pleural effusions with blunting of bilateral costophrenic sulci. No visible pneumothorax on this single semi erect radiograph. Right-sided skin fold. Cardiomediastinal silhouette is within normal limits. IMPRESSION: 1. Mildly improved right greater than left perihilar airspace opacities, which could represent aspiration, pneumonia, and/or asymmetric edema. 2. Suspected small bilateral pleural effusions. Electronically Signed   By: Margaretha Sheffield MD   On: 06/16/2021 07:01   DG  Chest Portable 1 View  Result Date: 06/15/2021 CLINICAL DATA:  Onset headache and chest pain this morning. EXAM: PORTABLE CHEST 1 VIEW COMPARISON:  None. FINDINGS: Endotracheal tube is in place with the tip in good position just below the clavicular heads. NG tube tip is in the fundus  of the stomach. There are bilateral pulmonary opacities, worse on the right. No pneumothorax or pleural effusion. Heart size is normal. IMPRESSION: ETT and NG tube in good position. Right greater than left perihilar opacities could be due to pneumonia or edema. Electronically Signed   By: Inge Rise M.D.   On: 06/15/2021 14:16   CT Maxillofacial Wo Contrast  Result Date: 06/15/2021 CLINICAL DATA:  Found on the ground.  Intoxicated. EXAM: CT HEAD WITHOUT CONTRAST CT MAXILLOFACIAL WITHOUT CONTRAST CT CERVICAL SPINE WITHOUT CONTRAST TECHNIQUE: Multidetector CT imaging of the head, cervical spine, and maxillofacial structures were performed using the standard protocol without intravenous contrast. Multiplanar CT image reconstructions of the cervical spine and maxillofacial structures were also generated. COMPARISON:  None. FINDINGS: CT HEAD FINDINGS Brain: The brain shows a normal appearance without evidence of malformation, atrophy, old or acute small or large vessel infarction, mass lesion, hemorrhage, hydrocephalus or extra-axial collection. Vascular: No hyperdense vessel. No evidence of atherosclerotic calcification. Skull: Normal.  No traumatic finding.  No focal bone lesion. Sinuses/Orbits: Sinuses are clear. Orbits appear normal. Mastoids are clear. Other: None significant CT MAXILLOFACIAL FINDINGS Osseous: No evidence of facial fracture. Orbits: No orbital injury. Sinuses: Some mucosal thickening and fluid of the right maxillary sinus. Other sinuses are clear. Soft tissues: No soft tissue face abnormality of significance. Endotracheal tube and orogastric tube in place. CT CERVICAL SPINE FINDINGS Alignment: No  malalignment. Skull base and vertebrae: No fracture or focal bone lesion. Soft tissues and spinal canal: No soft tissue neck lesion identified. Endotracheal tube and orogastric tube in place. Disc levels: No degenerative disc disease. No bony stenosis of the canal or foramina. Upper chest: Septal thickening in the upper lungs could reflect early edema. Other: None IMPRESSION: Head CT: Normal Maxillofacial CT: No traumatic finding. Some mucosal inflammatory disease of the right maxillary sinus. Cervical spine CT: No traumatic finding.  Normal. Endotracheal tube and orogastric tube in place. Septal thickening in the upper lungs consistent with pulmonary edema. Electronically Signed   By: Nelson Chimes M.D.   On: 06/15/2021 15:35    Cardiac Studies   Echo is pending today   Patient Profile     Sonia Howard is a 40 y.o. female with a hx of migraine, anxiety, depression, hypothyroidism, iron deficiency anemia, RLS, GERD, hx of overdose with opioid, tobacco abuse, hx of lap vertical sleeve gastrectomy in 2015, who is being seen 06/15/2021 for the evaluation of elevated trop at the request of Dr. Laverta Baltimore.   Assessment & Plan    Elevated Trop - unable perform history, called home and sister number (merge chart), no answer - EKG without acute ischemic findings, rate related ST depression  - Hs Trop 2116 >2252>1413 - BNP 1055  - Echo pending  - suspect demand ischemia, will follow Echo, no further workup from cardiac standpoint unless patient has meaningful neurological recovery    Acute metabolic encephalopathy - etiology unclear, hx of IVDU, drug overdose with opioid and with psychological issues - urine tox clean here,  ETOH/Salicylate negative; CK 546 mild rabdo; Bcx NTD; TSH WNL; HIV/FT4 Lorie Phenix /EEG/MRI pending  - query Wernicke's encephalopathy, given profound malnutrition clinically, may consider trial of thiamine  - may consider check AM cortisol rule out AI  - monitor neuro exam, care per PCCM     Ventilator dependent respiratory failure  - due to encephalopathy - on mechanical ventilation - care per PCCM   Presumed aspiration pneumonia - chest imaging concern for aspiration pneumonia - monitor  for Bcx, antigen study, and fever/hypoxia , consider empiric antibiotic   Hypokalemia - repleted/resolved, Mag WNL     Recurrent hypoglycemia - pending  endocrinology workup rule out AI, insuloma, etc - agree with Dextrose infusion, consider addition of thiamine trial    Leukopenia Hypothermia Sinus tachycardia  - lactic acid WNL, pending infectious work up, will defer to PCCM    Hypoalbuminemia BLE edema Cachexia -  suspect chronic poor nutrition intake based on clinical exam - TF started per PCCM    Polysubstance abuse Hypothyroidism Migraine Anxiety/depression GERD - defer to PCCM   For questions or updates, please contact [facility] Please consult [email or web address] for contact info under   Signed, Margie Billet, NP  06/16/2021, 10:01 AM   As above, patient seen and examined.  She remains unresponsive on the ventilator.  EEG consistent with severe metabolic encephalopathy versus anoxic brain injury.  We can consider further cardiac evaluation if her neurological status improves.  For now continue aspirin, heparin and statin.  Await results of echocardiogram.  Troponin was mildly elevated but may be secondary to demand ischemia. Kirk Ruths, MD

## 2021-06-16 NOTE — Procedures (Signed)
Central Venous Catheter Insertion Procedure Note  Sonia Howard  [id number]  1981/07/01  Date:06/16/21  Time:8:15 AM   Provider Performing:Datrell Dunton Elaina Pattee   Procedure: Insertion of Non-tunneled Central Venous [phone]) with US guidance ([id number])   Indication(s) Medication administration and Difficult access  Consent Risks of the procedure as well as the alternatives and risks of each were explained to the patient and/or caregiver.  Consent for the procedure was obtained and is signed in the bedside chart  Anesthesia Topical only with 1% lidocaine   Timeout Verified patient identification, verified procedure, site/side was marked, verified correct patient position, special equipment/implants available, medications/allergies/relevant history reviewed, required imaging and test results available.  Sterile Technique Maximal sterile technique including full sterile barrier drape, hand hygiene, sterile gown, sterile gloves, mask, hair covering, sterile ultrasound probe cover (if used).  Procedure Description Area of catheter insertion was cleaned with chlorhexidine and draped in sterile fashion.  With real-time ultrasound guidance a central venous catheter was placed into the left internal jugular vein. Nonpulsatile blood flow and easy flushing noted in all ports.  The catheter was sutured in place and sterile dressing applied.  Complications/Tolerance None; patient tolerated the procedure well. Chest X-ray is ordered to verify placement for internal jugular or subclavian cannulation.   Chest x-ray is not ordered for femoral cannulation.  EBL Minimal  Specimen(s) None

## 2021-06-16 NOTE — Progress Notes (Addendum)
ANTICOAGULATION CONSULT NOTE - Follow Up Consult  Pharmacy Consult:  IV Heparin Indication: chest pain/ACS  Allergies: Unable to Obtain (Unresponsive)  Patient Measurements: Height: 5\' 8"  (172.7 cm) Weight: 52.7 kg (116 lb 2.9 oz) IBW/kg (Calculated) : 63.9 Heparin Dosing Weight: 52.7 kg  Vital Signs: Temp: 98.6 F (37 C) (07/07 1507) Temp Source: Oral (07/07 1507) BP: 95/70 (07/07 1500) Pulse Rate: 103 (07/07 1500)  Labs: Recent Labs    06/15/21 1322 06/15/21 1621 06/16/21 0034 06/16/21 0331 06/16/21 0804 06/16/21 1512  HGB 12.9 13.9 12.6 11.2*  --   --   HCT 42.5 41.0 40.7 33.0*  --   --   PLT 218  --  190  --   --   --   LABPROT 13.7  --   --   --   --   --   INR 1.1  --   --   --   --   --   HEPARINUNFRC  --   --   --   --   --  0.10*  CREATININE 0.80  --  0.75  --   --   --   CKTOTAL  --   --  546*  --   --   --   TROPONINIHS 2,116*  --  2,252*  --  1,413*  --     Estimated Creatinine Clearance: 77.8 mL/min (by C-G formula based on SCr of 0.75 mg/dL).  Medical History: No past medical history on file.  Assessment: 40 yr old female was found unresponsive with severe hypoglycemia and elevated troponin (2116 > 2252 > 1413).  Pharmacy was consulted to start IV heparin for rule out ACS.  Patient received heparin SQ this AM at 0619 AM.  No bleeding reported.  Initial heparin level ~4.5 hrs after starting heparin infusion (no bolus) at 650 units/hr was 0.10 units/ml, which is below the goal range for this pt (RN indicated that heparin infusion was started at little late, but 6-hr heparin level will likely be below goal range). H/H 11.2/33.0, plt 190. Per RN, no issues with IV or bleeding observed.  Goal of Therapy:  Heparin level 0.3-0.7 units/ml Monitor platelets by anticoagulation protocol: Yes   Plan:  Heparin 1300 units IV bolus X 1 Increase heparin infusion to 800 units/hr Check 6-hr heparin level Monitor daily heparin level, CBC Monitor for  bleeding  02-07-2003, PharmD, BCPS, [facility] Clinical Pharmacist 06/16/2021, 4:35 PM

## 2021-06-16 NOTE — Progress Notes (Signed)
Pharmacy Antibiotic Note  Sonia Howard is a 40 y.o. female admitted on 06/15/2021 after being found unresponsive with severe hypoglycemia.  Patient was started on Zosyn for possible aspiration.  Now concerned with endocarditis, so Pharmacy has been consulted to switch antibiotic to vancomycin and cefepime.  Renal function stable, afebrile, WBC 1.9, LA 2.2.  Plan: Vanc 1gm IV x 1, then 750mg  IV Q12H for AUC 478 using SCr 0.8 Cefepime 2gm IV Q8H Monitor renal fxn, clinical progress, vanc levels at Css  Height: 5\' 8"  (172.7 cm) Weight: 52.7 kg (116 lb 2.9 oz) IBW/kg (Calculated) : 63.9  Temp (24hrs), Avg:97.5 F (36.4 C), Min:95.9 F (35.5 C), Max:99.6 F (37.6 C)  Recent Labs  Lab 06/15/21 1322 06/16/21 0034  WBC 2.5* 1.9*  CREATININE 0.80 0.75  LATICACIDVEN 1.9 2.2*    Estimated Creatinine Clearance: 77.8 mL/min (by C-G formula based on SCr of 0.75 mg/dL).    Not on File  Zosyn 7/6 >> 7/7 Vanc 7/7 >> Cefepime 7/7 >>   7/6 MRSA PCR - negative 7/7 BCx -    Sonia Howard D. 9/7, PharmD, BCPS, BCCCP 06/16/2021, 7:33 AM

## 2021-06-17 ENCOUNTER — Inpatient Hospital Stay (HOSPITAL_COMMUNITY): Payer: Medicaid Other

## 2021-06-17 DIAGNOSIS — E43 Unspecified severe protein-calorie malnutrition: Secondary | ICD-10-CM | POA: Insufficient documentation

## 2021-06-17 LAB — GLUCOSE, CAPILLARY
Glucose-Capillary: 188 mg/dL — ABNORMAL HIGH (ref 70–99)
Glucose-Capillary: 178 mg/dL — ABNORMAL HIGH (ref 70–99)
Glucose-Capillary: 150 mg/dL — ABNORMAL HIGH (ref 70–99)
Glucose-Capillary: 148 mg/dL — ABNORMAL HIGH (ref 70–99)
Glucose-Capillary: 134 mg/dL — ABNORMAL HIGH (ref 70–99)
Glucose-Capillary: 124 mg/dL — ABNORMAL HIGH (ref 70–99)

## 2021-06-17 LAB — COMPREHENSIVE METABOLIC PANEL
ALT: 39 U/L (ref 0–44)
AST: 45 U/L — ABNORMAL HIGH (ref 15–41)
Albumin: 1.6 g/dL — ABNORMAL LOW (ref 3.5–5.0)
Alkaline Phosphatase: 68 U/L (ref 38–126)
Anion gap: 5 (ref 5–15)
BUN: 14 mg/dL (ref 6–20)
CO2: 28 mmol/L (ref 22–32)
Calcium: 7.5 mg/dL — ABNORMAL LOW (ref 8.9–10.3)
Chloride: 105 mmol/L (ref 98–111)
Creatinine, Ser: 0.52 mg/dL (ref 0.44–1.00)
GFR, Estimated: >60 mL/min (ref >60)
Glucose, Bld: 172 mg/dL — ABNORMAL HIGH (ref 70–99)
Potassium: 3.3 mmol/L — ABNORMAL LOW (ref 3.5–5.1)
Sodium: 138 mmol/L (ref 135–145)
Total Bilirubin: 0.4 mg/dL (ref 0.3–1.2)
Total Protein: 4.5 g/dL — ABNORMAL LOW (ref 6.5–8.1)

## 2021-06-17 LAB — C-PEPTIDE: C-Peptide: 0.3 ng/mL — ABNORMAL LOW (ref 1.1–4.4)

## 2021-06-17 LAB — CBC
HCT: 30 % — ABNORMAL LOW (ref 36.0–46.0)
Hemoglobin: 10 g/dL — ABNORMAL LOW (ref 12.0–15.0)
MCH: 28.9 pg (ref 26.0–34.0)
MCHC: 33.3 g/dL (ref 30.0–36.0)
MCV: 86.7 fL (ref 80.0–100.0)
Platelets: 146 10*3/uL — ABNORMAL LOW (ref 150–400)
RBC: 3.46 MIL/uL — ABNORMAL LOW (ref 3.87–5.11)
RDW: 22.5 % — ABNORMAL HIGH (ref 11.5–15.5)
WBC: 5.9 10*3/uL (ref 4.0–10.5)
nRBC: 0 % (ref 0.0–0.2)

## 2021-06-17 LAB — PHOSPHORUS
Phosphorus: 1.7 mg/dL — ABNORMAL LOW (ref 2.5–4.6)
Phosphorus: 1.4 mg/dL — ABNORMAL LOW (ref 2.5–4.6)

## 2021-06-17 LAB — HEPARIN LEVEL (UNFRACTIONATED): Heparin Unfractionated: 0.12 IU/mL — ABNORMAL LOW (ref 0.30–0.70)

## 2021-06-17 LAB — T4, FREE: Free T4: 0.93 ng/dL (ref 0.61–1.12)

## 2021-06-17 LAB — MAGNESIUM: Magnesium: 1.7 mg/dL (ref 1.7–2.4)

## 2021-06-17 LAB — CORTISOL: Cortisol, Plasma: 35.8 ug/dL

## 2021-06-17 LAB — ZINC: Zinc: 33 ug/dL — ABNORMAL LOW (ref 44–115)

## 2021-06-17 IMAGING — DX DG ABD PORTABLE 1V
1 series · 1 of 1 positions shown · IV contrast (agent unspecified)
Comparison: Same day radiograph

CLINICAL DATA: Confirmation of feeding tube tip post pyloric with
contrast

EXAM:
PORTABLE ABDOMEN - 1 VIEW

[abdomen]
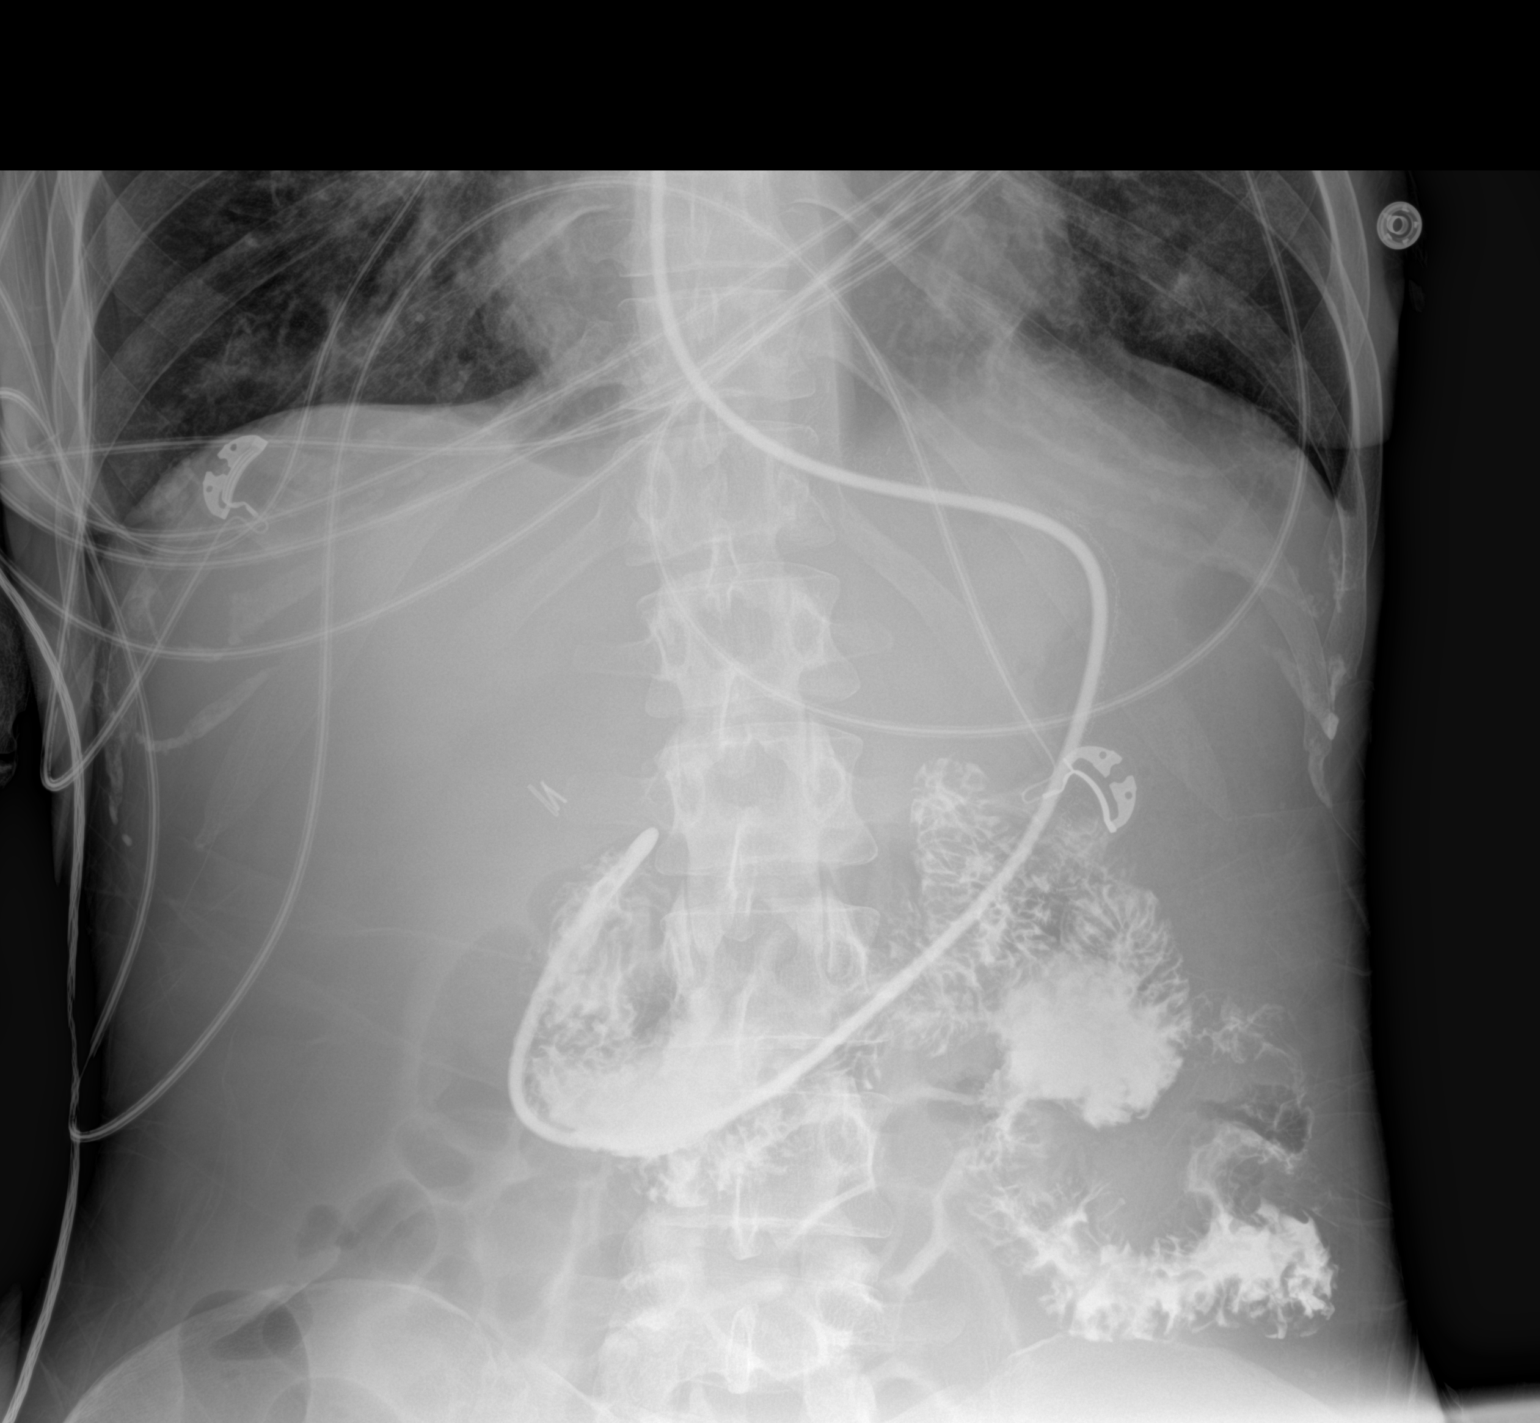

[1 of 1 positions shown; findings below may reference images not displayed]

FINDINGS: Radiograph is obtained after injection of 30 mL of Gastrografin
through the patient's feeding tube. Contrast is seen flowing
distally throughout the duodenum and into the proximal jejunum.
There is no evidence of reflux into the stomach on this single
image. There is a partially visualized central venous catheter tip
overlying the superior aspect of the right atrium. There is no
evidence of bowel obstruction. There are right upper quadrant
surgical clips noted. The lung bases demonstrate patchy bilateral
airspace opacities, see recent chest radiograph.
IMPRESSION: Feeding tube tip is post pyloric within the proximal duodenum.

## 2021-06-17 IMAGING — DX DG ABD PORTABLE 1V
1 series · 2 of 2 positions shown · non-contrast
Comparison: None.

CLINICAL DATA: Feeding tube placement.

EXAM:
PORTABLE ABDOMEN - 1 VIEW

[Series 1: abdomen · 0.14mm/px · 2 of 2 slices shown]
[im 1/2]
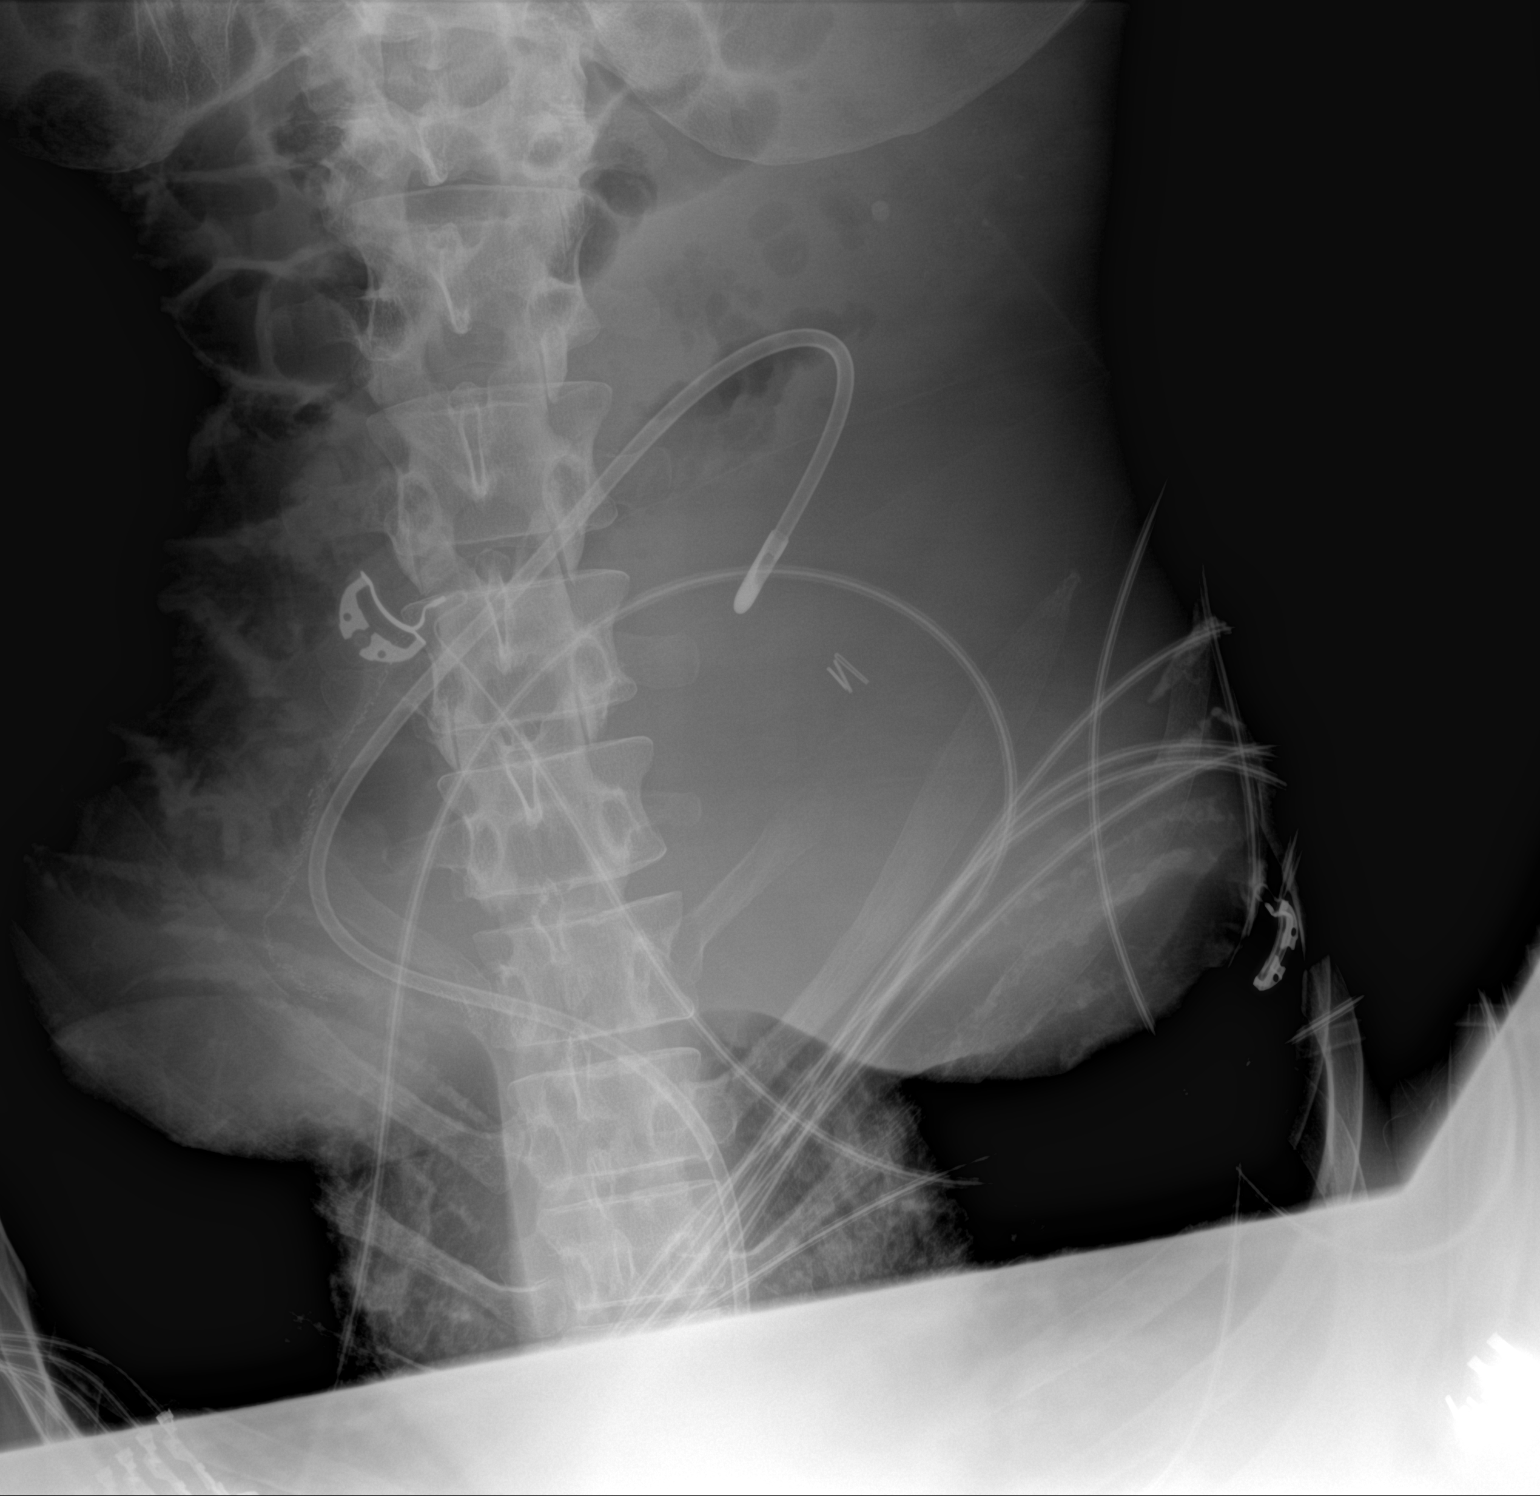
[im 2/2]
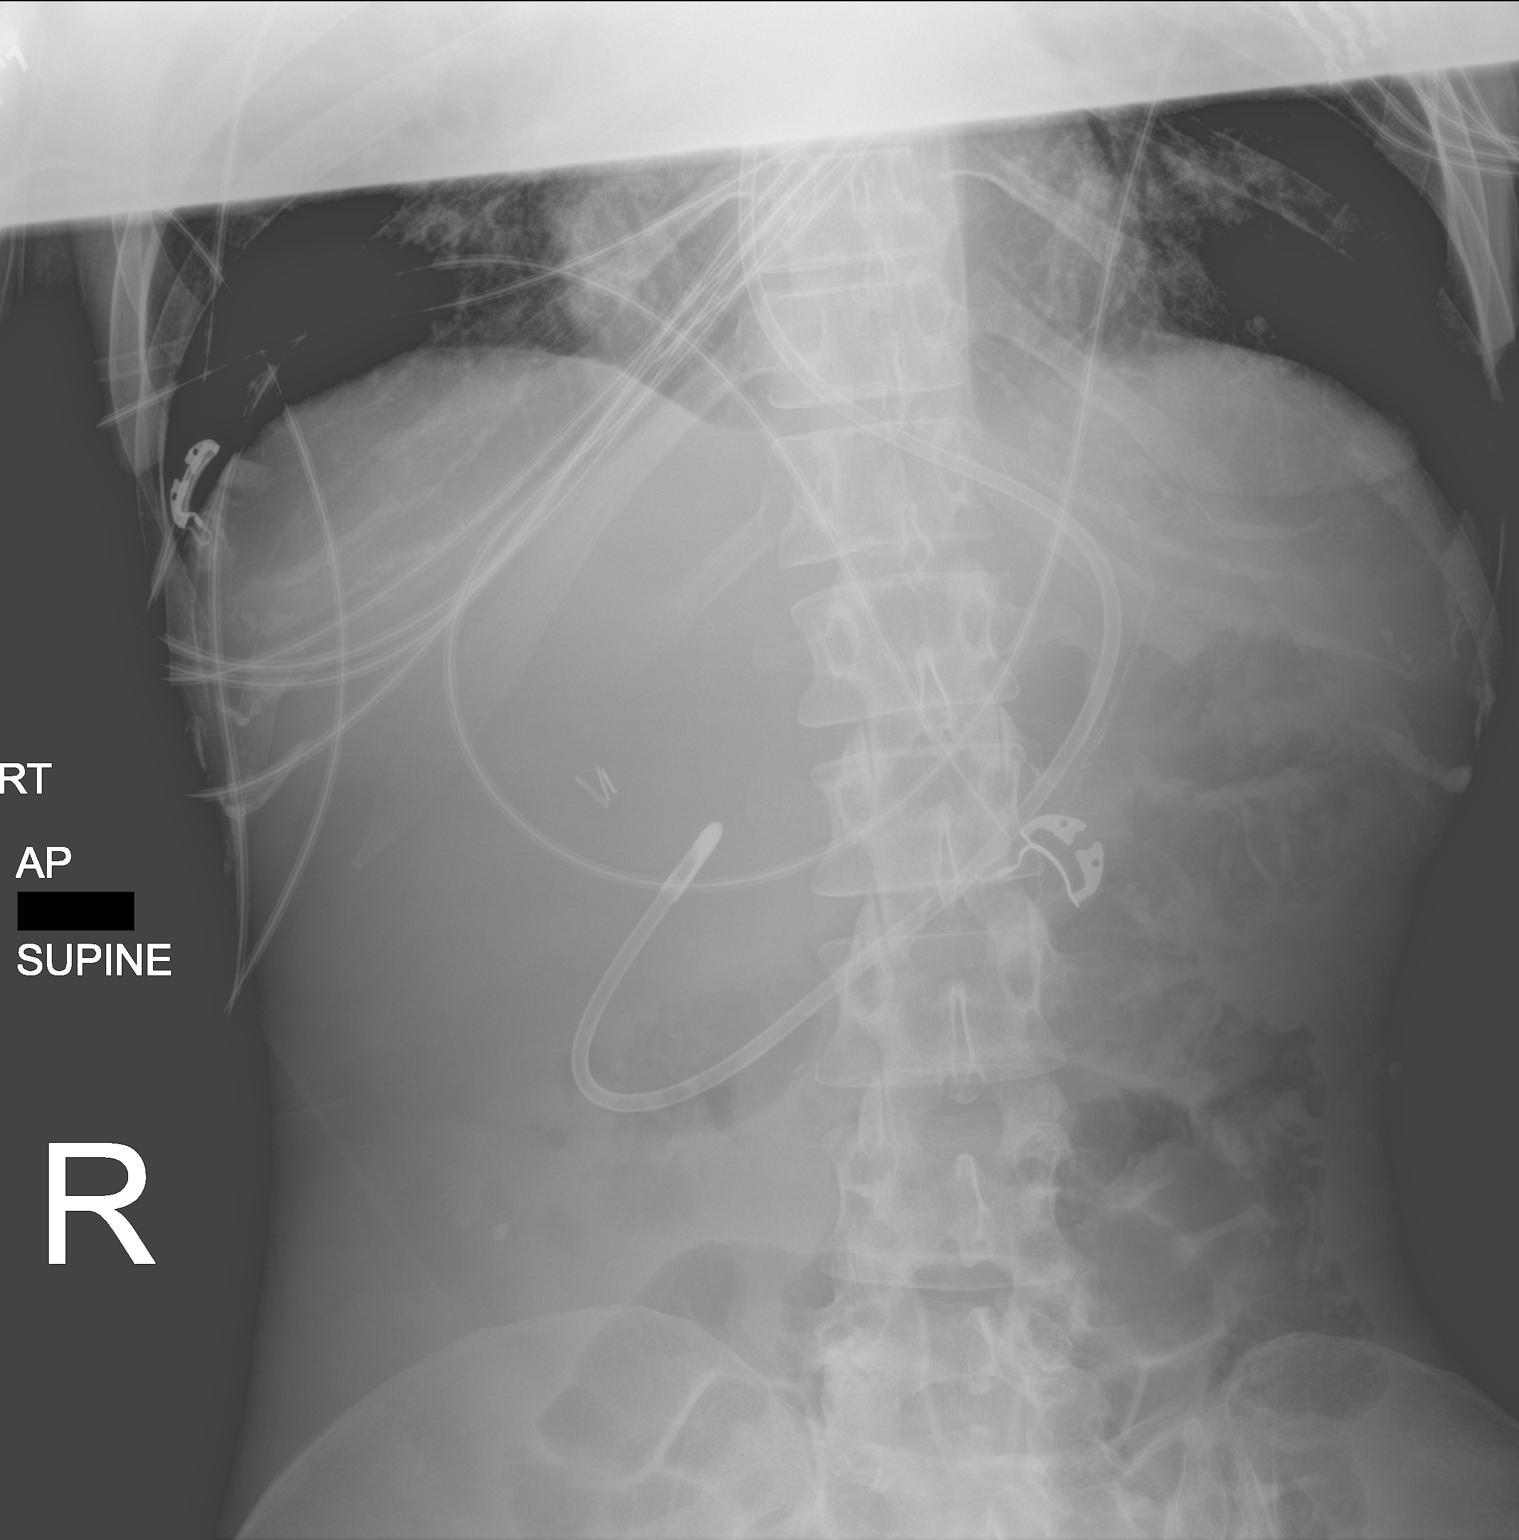

[2 of 2 positions shown; findings below may reference images not displayed]

FINDINGS: The bowel gas pattern is normal. Distal tip of feeding tube is seen
either in the distal stomach or proximal duodenum. No radio-opaque
calculi or other significant radiographic abnormality are seen.
IMPRESSION: Distal tip of feeding tube is seen in either the expected position
of the distal stomach or proximal duodenum.

## 2021-06-17 MED ORDER — HEPARIN SODIUM (PORCINE) 5000 UNIT/ML IJ SOLN
5000.0000 [IU] | Freq: Three times a day (TID) | INTRAMUSCULAR | Status: DC
  Administered 2021-06-17 – 2021-06-20 (×9): 5000 [IU] via SUBCUTANEOUS
  Filled 2021-06-17 (×9): qty 1

## 2021-06-17 MED ORDER — ADULT MULTIVITAMIN W/MINERALS CH
1.0000 | ORAL_TABLET | Freq: Two times a day (BID) | ORAL | Status: DC
  Administered 2021-06-17 – 2021-07-01 (×28): 1
  Filled 2021-06-17 (×28): qty 1

## 2021-06-17 MED ORDER — CALCIUM CARBONATE ANTACID 1250 MG/5ML PO SUSP
250.0000 mg | Freq: Three times a day (TID) | ORAL | Status: DC
  Administered 2021-06-17 – 2021-06-21 (×12): 250 mg
  Filled 2021-06-17 (×15): qty 5

## 2021-06-17 MED ORDER — THIAMINE HCL 100 MG PO TABS
500.0000 mg | ORAL_TABLET | Freq: Three times a day (TID) | ORAL | Status: AC
  Administered 2021-06-17 – 2021-06-18 (×6): 500 mg
  Filled 2021-06-17 (×6): qty 5

## 2021-06-17 MED ORDER — PANCRELIPASE (LIP-PROT-AMYL) 10440-39150 UNITS PO TABS
20880.0000 [IU] | ORAL_TABLET | Freq: Once | ORAL | Status: AC
  Administered 2021-06-17: 20880 [IU]
  Filled 2021-06-17: qty 2

## 2021-06-17 MED ORDER — SODIUM BICARBONATE 650 MG PO TABS
650.0000 mg | ORAL_TABLET | Freq: Once | ORAL | Status: AC
  Administered 2021-06-17: 650 mg
  Filled 2021-06-17: qty 1

## 2021-06-17 MED ORDER — HEPARIN BOLUS VIA INFUSION
1200.0000 [IU] | Freq: Once | INTRAVENOUS | Status: AC
  Administered 2021-06-17: 1200 [IU] via INTRAVENOUS
  Filled 2021-06-17: qty 1200

## 2021-06-17 MED ORDER — THIAMINE HCL 100 MG PO TABS
100.0000 mg | ORAL_TABLET | Freq: Every day | ORAL | Status: DC
  Administered 2021-06-19 – 2021-07-01 (×13): 100 mg
  Filled 2021-06-17 (×14): qty 1

## 2021-06-17 MED ORDER — ASPIRIN 81 MG PO CHEW
81.0000 mg | CHEWABLE_TABLET | Freq: Every day | ORAL | Status: DC
  Administered 2021-06-17 – 2021-06-21 (×5): 81 mg
  Filled 2021-06-17 (×5): qty 1

## 2021-06-17 MED ORDER — SODIUM PHOSPHATES 45 MMOLE/15ML IV SOLN
45.0000 mmol | Freq: Once | INTRAVENOUS | Status: AC
  Administered 2021-06-17: 45 mmol via INTRAVENOUS
  Filled 2021-06-17: qty 15

## 2021-06-17 MED ORDER — POTASSIUM PHOSPHATES 15 MMOLE/5ML IV SOLN
30.0000 mmol | Freq: Once | INTRAVENOUS | Status: AC
  Administered 2021-06-17: 30 mmol via INTRAVENOUS
  Filled 2021-06-17: qty 10

## 2021-06-17 MED ORDER — CALCIUM CARBONATE ANTACID 500 MG PO CHEW
1.0000 | CHEWABLE_TABLET | Freq: Three times a day (TID) | ORAL | Status: DC
  Administered 2021-06-17: 200 mg
  Filled 2021-06-17: qty 1

## 2021-06-17 MED ORDER — SODIUM CHLORIDE 0.9% FLUSH: 10.0000 mL | INTRAVENOUS | Status: DC | PRN

## 2021-06-17 MED ORDER — SODIUM CHLORIDE 0.9 % IV SOLN
2.0000 g | INTRAVENOUS | Status: AC
  Administered 2021-06-18 – 2021-06-21 (×4): 2 g via INTRAVENOUS
  Filled 2021-06-17 (×4): qty 20

## 2021-06-17 MED ORDER — DIATRIZOATE MEGLUMINE & SODIUM 66-10 % PO SOLN
ORAL | Status: AC
  Administered 2021-06-17: 30 mL via GASTROSTOMY
  Filled 2021-06-17: qty 30

## 2021-06-17 MED ORDER — SODIUM CHLORIDE 0.9% FLUSH
10.0000 mL | Freq: Two times a day (BID) | INTRAVENOUS | Status: DC
  Administered 2021-06-17 – 2021-06-18 (×4): 10 mL
  Administered 2021-06-19: 20 mL
  Administered 2021-06-19 – 2021-06-21 (×4): 10 mL

## 2021-06-17 NOTE — Progress Notes (Deleted)
This RN called pt.'s mother, Lorayne Marek, and notified her that the pt. is up for discharge and awaiting [facility] transportation to facility.

## 2021-06-17 NOTE — Consult Note (Signed)
Neurology Consultation Reason for Consult: prognosis after being found down Referring Physician: Dr Levon Hedger  CC: unresponsiveness  History is obtained from:chart review as patient unresponsive  HPI: Sonia Howard is a 40 y.o. female with past medical history of heroin use disorder, opioid use disorder, depression and chronic migraine who was brought in on 06/15/2021 after she was found unresponsive at her friend's house.  Per EMS, initial blood sugar was 23.  She was given D50 as well as 2 mg of Narcan without any response.  She was brought into the emergency room where she was intubated due to a GCS of 3.  In the emergency room, patient's blood sugar was again 12 and was administered D50 x2 and started on D10.  EEG was performed on 06/16/2021 which showed generalized periodic discharges with triphasic morphology at 1 Hz.  MRI brain was performed on 06/17/2019 without any acute abnormality.  Neurology was consulted today to help with prognosis.  Patient has 2 charts in epic.  I was able to review patient's other chart under MRN [id number].  I have personally reviewed patient's most recent psychiatry note from February 28, 2021 summarized as follows.  Patient has a history of heroin abuse.  She started abusing opioids to avoid withdrawal from heroine and was concerned that she may accidentally overdose.  She was recommended inpatient hospitalization during that visit and completed voluntary inpatient psychiatric stay at [facility] from 02/28/2021 to 03/04/2021.  ROS: Unable to obtain due to altered mental status.   Unable to obtain past medical history, surgical history, family history and social history as patient comatose and unable to answer any questions   Exam: Current vital signs: BP 99/76   Pulse 76   Temp 99.2 F (37.3 C) (Axillary)   Resp (!) 24   Ht 5\' 8"  (1.727 m)   Wt 53.8 kg   SpO2 100%   BMI 18.03 kg/m  Vital signs in last 24 hours: Temp:  [96.9 F (36.1 C)-99.2 F (37.3  C)] 99.2 F (37.3 C) (07/08 0733) Pulse Rate:  [68-122] 76 (07/08 0900) Resp:  [20-35] 24 (07/08 0900) BP: (63-200)/(42-169) 99/76 (07/08 0900) SpO2:  [92 %-100 %] 100 % (07/08 0900) FiO2 (%):  [40 %-60 %] 60 % (07/08 0751) Weight:  [53.8 kg] 53.8 kg (07/08 0407)   Physical Exam  Constitutional: Appears well-developed and well-nourished.  Psych: Unable to assess as patient comatose Eyes: No scleral injection HENT: No OP obstrucion Head: Normocephalic.  Cardiovascular: Normal rate and regular rhythm.  Respiratory: Intubated, symmetric chest expansion bilaterally GI: Soft.  No distension. There is no tenderness.  Skin: Warm Neuro: Comatose, does not open eyes noxious stimuli, dilated pupils but reactive to light, corneal reflex present, gag reflex present, withdraws to noxious stimuli in all extremities except left upper extremity, hyperreflexic  I have reviewed labs in epic and the results pertinent to this consultation are: CBC:  Recent Labs  Lab 06/15/21 1322 06/15/21 1621 06/16/21 0034 06/16/21 0331 06/17/21 0414  WBC 2.5*  --  1.9*  --  5.9  NEUTROABS 1.9  --   --   --   --   HGB 12.9   < > 12.6 11.2* 10.0*  HCT 42.5   < > 40.7 33.0* 30.0*  MCV 90.0  --  87.9  --  86.7  PLT 218  --  190  --  146*   < > = values in this interval not displayed.    Basic Metabolic Panel:  Lab  Results  Component Value Date   NA 138 06/17/2021   K 3.3 (L) 06/17/2021   CO2 28 06/17/2021   GLUCOSE 172 (H) 06/17/2021   BUN 14 06/17/2021   CREATININE 0.52 06/17/2021   CALCIUM 7.5 (L) 06/17/2021   GFRNONAA >60 06/17/2021   Lipid Panel: No results found for: LDLCALC HgbA1c: No results found for: HGBA1C Urine Drug Screen:     Component Value Date/Time   LABOPIA NONE DETECTED 06/15/2021 1536   COCAINSCRNUR NONE DETECTED 06/15/2021 1536   LABBENZ NONE DETECTED 06/15/2021 1536   AMPHETMU NONE DETECTED 06/15/2021 1536   THCU NONE DETECTED 06/15/2021 1536   LABBARB NONE DETECTED  06/15/2021 1536    Alcohol Level     Component Value Date/Time   ETH <10 06/15/2021 1322     I have reviewed the images obtained: CT head without contrast 06/15/2021: No acute abnormality  MRI brain without contrast 06/16/2021: No acute abnormality   ASSESSMENT/PLAN: 40 year old female with history heroin use disorder, opioid use disorder who was brought in after being found down, blood glucose was 23, also concern for overdose.  Acute encephalopathy, likely toxic-metabolic Rhabdomyolysis Hypoglycemia -Routine EEG showed generalized periodic discharges with triphasic morphology at 1 Hz -MRI brain did not show any acute abnormality   Recommendations: -Agree with video EEG as routine EEG showed generalized periodic discharges -We will hold off on starting any AEDs unless we see definite ictal-interictal abnormality -Minimize sedation -I called and spoke with patient Sister Candace who is a Engineer, civil (consulting).  Per sister, there is concerned that patient may have injected Suboxone.  I discussed with patient sister that even the MRI did not show any injury, it is possible that there may have been some neurological injury.  I would recommend waiting for another 48 to 72 hours and reevaluate on Monday morning to assess neurological improvement. -Continue seizure precautions -Management of rest of comorbidities per primary team  I have spent a total of 125  minutes with the patient reviewing hospital notes,  test results, labs and examining the patient as well as establishing an assessment and plan.  > 50% of time was spent in direct patient care.   Thank you for allowing [place] to participate in the care of this patient. If you have any further questions, please contact  me or neurohospitalist.   Lindie Spruce [facility]ist

## 2021-06-17 NOTE — Progress Notes (Signed)
eLink Physician-Brief Progress Note Patient Name: Sonia Howard DOB: 07-23-1981 MRN: [id number]   Date of Service  06/17/2021  HPI/Events of Note  CBG 180. On tube feeds and D5 at 50 mL/hr.   eICU Interventions  Discontinued D5 order. Continue tube feeds.     Intervention Category Intermediate Interventions: Hyperglycemia - evaluation and treatment  Janae Bridgeman 06/17/2021, 12:28 AM

## 2021-06-17 NOTE — Progress Notes (Signed)
Patient sister in law, Sonny Masters will like a call from neurology.

## 2021-06-17 NOTE — Progress Notes (Addendum)
Progress Note  Patient Name: Sonia Howard Date of Encounter: 06/17/2021  [facility] Cardiologist: New to Dr Jens Som  Subjective   Patient remains on ventilator. She is localizing to pain of hands and withdrawal from pain of feet.  She is on dexmedetomidine gtt overnight, which was turned off this morning at 620AM.  Staff RN reports there is no response from the patient so far, no agitation or delirium reported.   Inpatient Medications    Scheduled Meds:  aspirin EC  81 mg Oral Daily   atorvastatin  20 mg Per Tube Daily   chlorhexidine gluconate (MEDLINE KIT)  15 mL Mouth Rinse BID   Chlorhexidine Gluconate Cloth  6 each Topical Daily   docusate  100 mg Per Tube BID   feeding supplement (PROSource TF)  45 mL Per Tube Daily   mouth rinse  15 mL Mouth Rinse 10 times per day   pantoprazole sodium  40 mg Per Tube Daily   polyethylene glycol  17 g Per Tube Daily   sodium chloride flush  10-40 mL Intracatheter Q12H   thiamine  500 mg Oral TID   Followed by   Melene Muller ON 06/19/2021] thiamine  100 mg Oral Daily   Continuous Infusions:  sodium chloride 10 mL/hr at 06/17/21 0600   sodium chloride     sodium chloride     cefTRIAXone (ROCEPHIN)  IV Stopped (06/17/21 0159)   dexmedetomidine (PRECEDEX) IV infusion 1 mcg/kg/hr (06/17/21 3825)   feeding supplement (VITAL 1.5 CAL) 1,000 mL (06/16/21 1015)   heparin 950 Units/hr (06/17/21 0600)   norepinephrine (LEVOPHED) Adult infusion 4 mcg/min (06/17/21 0400)   phenylephrine (NEO-SYNEPHRINE) Adult infusion     potassium PHOSPHATE IVPB (in mmol) 30 mmol (06/17/21 0620)   rocuronium 60 mg/kg/hr (06/15/21 1316)   vancomycin Stopped (06/16/21 2214)   PRN Meds: docusate, etomidate, fentaNYL (SUBLIMAZE) injection, ondansetron (ZOFRAN) IV, polyethylene glycol, rocuronium, sodium chloride flush   Vital Signs    Vitals:   06/17/21 0600 06/17/21 0615 06/17/21 0630 06/17/21 0645  BP: 108/72 108/68 104/62 109/67  Pulse: 86 88 83 83   Resp: (!) 23 (!) 23 (!) 23 (!) 25  Temp:      TempSrc:      SpO2: 100% 100% 96% 100%  Weight:      Height:        Intake/Output Summary (Last 24 hours) at 06/17/2021 0703 Last data filed at 06/17/2021 0600 Gross per 24 hour  Intake 3192.3 ml  Output 700 ml  Net 2492.3 ml    Last 3 Weights 06/17/2021 06/16/2021 06/15/2021  Weight (lbs) 118 lb 9.7 oz 116 lb 2.9 oz 132 lb 4.4 oz  Weight (kg) 53.8 kg 52.7 kg 60 kg      Telemetry    Sinus rhythm 70s this morning, was ST at  100s - Personally Reviewed  ECG    N.A this AM - Personally Reviewed  Physical Exam   GEN: Cachetic. Intubated.  Neck: No JVD Cardiac: RRR, no murmurs, rubs, or gallops.  Respiratory: on mechanical ventilator support, FIO2 60%, PEEP 10, RR18, TV 510, lungs sound rhonchus  GI: Soft, nontender, non-distended  MS: 2-3+ pitting BLE edema; No deformity. Neuro: Pupils less dilated today round and reactive to light, bilateral feet withdrawals from painful stimuli, bilateral hands localize to pain, no posture or increased tone, off sedation at 620AM today, no spontaneous movement observed, neuro exam is poor. GCS 6 (E1, V1, M4) Psych: unable to assess Left IJ CVL with  dressing in place Foley with yellow urine  OJ in place   Labs    High Sensitivity Troponin:   Recent Labs  Lab 06/15/21 1322 06/16/21 0034 06/16/21 0804  TROPONINIHS 2,116* 2,252* 1,413*       Chemistry Recent Labs  Lab 06/15/21 1322 06/15/21 1621 06/16/21 0034 06/16/21 0331 06/17/21 0416  NA 145   < > 140 139 138  K 2.6*   < > 4.4 4.2 3.3*  CL 110  --  112*  --  105  CO2 28  --  24  --  28  GLUCOSE 41*  --  111*  --  172*  BUN 21*  --  17  --  14  CREATININE 0.80  --  0.75  --  0.52  CALCIUM 7.7*  --  7.6*  --  7.5*  PROT 5.5*  --   --   --  4.5*  ALBUMIN 2.3*  --   --   --  1.6*  AST 61*  --   --   --  45*  ALT 45*  --   --   --  39  ALKPHOS 79  --   --   --  68  BILITOT 0.5  --   --   --  0.4  GFRNONAA >60  --  >60  --  >60   ANIONGAP 7  --  4*  --  5   < > = values in this interval not displayed.      Hematology Recent Labs  Lab 06/15/21 1322 06/15/21 1621 06/16/21 0034 06/16/21 0331 06/17/21 0414  WBC 2.5*  --  1.9*  --  5.9  RBC 4.72  --  4.63  --  3.46*  HGB 12.9   < > 12.6 11.2* 10.0*  HCT 42.5   < > 40.7 33.0* 30.0*  MCV 90.0  --  87.9  --  86.7  MCH 27.3  --  27.2  --  28.9  MCHC 30.4  --  31.0  --  33.3  RDW 22.5*  --  22.5*  --  22.5*  PLT 218  --  190  --  146*   < > = values in this interval not displayed.     BNP Recent Labs  Lab 06/16/21 0034  BNP 1,055.3*      DDimer No results for input(s): DDIMER in the last 168 hours.   Radiology    DG Chest 1 View  Result Date: 06/16/2021 CLINICAL DATA:  Central line placement EXAM: CHEST  1 VIEW COMPARISON:  Earlier same day FINDINGS: New left IJ central line tip overlies the superior right atrium. Enteric tube passes into the stomach with tip out of field of view. Endotracheal tube is unchanged. Persistent bilateral opacities with relative sparing of the periphery. Aeration is similar. No significant pleural effusion. No pneumothorax. Stable cardiomediastinal contours. IMPRESSION: New right IJ line tip overlies superior right atrium. No pneumothorax. Similar lung aeration with persistent bilateral opacities. Electronically Signed   By: Macy Mis M.D.   On: 06/16/2021 09:03   CT Head Wo Contrast  Result Date: 06/15/2021 CLINICAL DATA:  Found on the ground.  Intoxicated. EXAM: CT HEAD WITHOUT CONTRAST CT MAXILLOFACIAL WITHOUT CONTRAST CT CERVICAL SPINE WITHOUT CONTRAST TECHNIQUE: Multidetector CT imaging of the head, cervical spine, and maxillofacial structures were performed using the standard protocol without intravenous contrast. Multiplanar CT image reconstructions of the cervical spine and maxillofacial structures were also generated. COMPARISON:  None. FINDINGS: CT  HEAD FINDINGS Brain: The brain shows a normal appearance without  evidence of malformation, atrophy, old or acute small or large vessel infarction, mass lesion, hemorrhage, hydrocephalus or extra-axial collection. Vascular: No hyperdense vessel. No evidence of atherosclerotic calcification. Skull: Normal.  No traumatic finding.  No focal bone lesion. Sinuses/Orbits: Sinuses are clear. Orbits appear normal. Mastoids are clear. Other: None significant CT MAXILLOFACIAL FINDINGS Osseous: No evidence of facial fracture. Orbits: No orbital injury. Sinuses: Some mucosal thickening and fluid of the right maxillary sinus. Other sinuses are clear. Soft tissues: No soft tissue face abnormality of significance. Endotracheal tube and orogastric tube in place. CT CERVICAL SPINE FINDINGS Alignment: No malalignment. Skull base and vertebrae: No fracture or focal bone lesion. Soft tissues and spinal canal: No soft tissue neck lesion identified. Endotracheal tube and orogastric tube in place. Disc levels: No degenerative disc disease. No bony stenosis of the canal or foramina. Upper chest: Septal thickening in the upper lungs could reflect early edema. Other: None IMPRESSION: Head CT: Normal Maxillofacial CT: No traumatic finding. Some mucosal inflammatory disease of the right maxillary sinus. Cervical spine CT: No traumatic finding.  Normal. Endotracheal tube and orogastric tube in place. Septal thickening in the upper lungs consistent with pulmonary edema. Electronically Signed   By: Nelson Chimes M.D.   On: 06/15/2021 15:35   CT Cervical Spine Wo Contrast  Result Date: 06/15/2021 CLINICAL DATA:  Found on the ground.  Intoxicated. EXAM: CT HEAD WITHOUT CONTRAST CT MAXILLOFACIAL WITHOUT CONTRAST CT CERVICAL SPINE WITHOUT CONTRAST TECHNIQUE: Multidetector CT imaging of the head, cervical spine, and maxillofacial structures were performed using the standard protocol without intravenous contrast. Multiplanar CT image reconstructions of the cervical spine and maxillofacial structures were also  generated. COMPARISON:  None. FINDINGS: CT HEAD FINDINGS Brain: The brain shows a normal appearance without evidence of malformation, atrophy, old or acute small or large vessel infarction, mass lesion, hemorrhage, hydrocephalus or extra-axial collection. Vascular: No hyperdense vessel. No evidence of atherosclerotic calcification. Skull: Normal.  No traumatic finding.  No focal bone lesion. Sinuses/Orbits: Sinuses are clear. Orbits appear normal. Mastoids are clear. Other: None significant CT MAXILLOFACIAL FINDINGS Osseous: No evidence of facial fracture. Orbits: No orbital injury. Sinuses: Some mucosal thickening and fluid of the right maxillary sinus. Other sinuses are clear. Soft tissues: No soft tissue face abnormality of significance. Endotracheal tube and orogastric tube in place. CT CERVICAL SPINE FINDINGS Alignment: No malalignment. Skull base and vertebrae: No fracture or focal bone lesion. Soft tissues and spinal canal: No soft tissue neck lesion identified. Endotracheal tube and orogastric tube in place. Disc levels: No degenerative disc disease. No bony stenosis of the canal or foramina. Upper chest: Septal thickening in the upper lungs could reflect early edema. Other: None IMPRESSION: Head CT: Normal Maxillofacial CT: No traumatic finding. Some mucosal inflammatory disease of the right maxillary sinus. Cervical spine CT: No traumatic finding.  Normal. Endotracheal tube and orogastric tube in place. Septal thickening in the upper lungs consistent with pulmonary edema. Electronically Signed   By: Nelson Chimes M.D.   On: 06/15/2021 15:35   MR BRAIN WO CONTRAST  Result Date: 06/16/2021 CLINICAL DATA:  Found unresponsive. EXAM: MRI HEAD WITHOUT CONTRAST TECHNIQUE: Multiplanar, multiecho pulse sequences of the brain and surrounding structures were obtained without intravenous contrast. COMPARISON:  CT head from S-shaped. FINDINGS: Motion limited evaluation.  Within this limitation: Brain: No acute  infarction, hemorrhage, hydrocephalus, extra-axial collection or mass lesion. Vascular: Major arterial flow voids are maintained at the skull  base. Skull and upper cervical spine: No focal marrow replacing lesion. Sinuses/Orbits: Right maxillary sinus and right sphenoid sinus mucosal thickening. No acute orbital findings. Other: Trace bilateral mastoid fluid. IMPRESSION: No evidence of acute intracranial abnormality on this motion limited exam. Electronically Signed   By: Margaretha Sheffield MD   On: 06/16/2021 19:08   DG Chest Port 1 View  Result Date: 06/16/2021 CLINICAL DATA:  Acute hypoxic respiratory failure. EXAM: PORTABLE CHEST 1 VIEW COMPARISON:  June 15, 2021. FINDINGS: Endotracheal tube tip is approximately 3.8 cm above the carina. Gastric tube courses below the diaphragm with the tip outside the field of view and the side port below the GE junction. Mildly improved right greater than left perihilar airspace opacities. Suspected small bilateral pleural effusions with blunting of bilateral costophrenic sulci. No visible pneumothorax on this single semi erect radiograph. Right-sided skin fold. Cardiomediastinal silhouette is within normal limits. IMPRESSION: 1. Mildly improved right greater than left perihilar airspace opacities, which could represent aspiration, pneumonia, and/or asymmetric edema. 2. Suspected small bilateral pleural effusions. Electronically Signed   By: Margaretha Sheffield MD   On: 06/16/2021 07:01   DG Chest Portable 1 View  Result Date: 06/15/2021 CLINICAL DATA:  Onset headache and chest pain this morning. EXAM: PORTABLE CHEST 1 VIEW COMPARISON:  None. FINDINGS: Endotracheal tube is in place with the tip in good position just below the clavicular heads. NG tube tip is in the fundus of the stomach. There are bilateral pulmonary opacities, worse on the right. No pneumothorax or pleural effusion. Heart size is normal. IMPRESSION: ETT and NG tube in good position. Right greater than left  perihilar opacities could be due to pneumonia or edema. Electronically Signed   By: Inge Rise M.D.   On: 06/15/2021 14:16   EEG adult  Result Date: 06/16/2021 Lora Havens, MD     06/16/2021 10:11 AM Patient Name: Sonia Howard MRN: [id number] Epilepsy Attending: Lora Havens Referring Physician/Provider: Dr. Ina Homes Date: 06/16/2021 Duration: 24.43 minutes Patient history: 40 year old female with history of drug overdose and unintentional weight loss brought in after she was found unresponsive at a friend's home.  Blood sugar was 23.  EEG to evaluate for seizures. Level of alertness: Comatose AEDs during EEG study: None Technical aspects: This EEG study was done with scalp electrodes positioned according to the 10-20 International system of electrode placement. Electrical activity was acquired at a sampling rate of $Remov'500Hz'oQUQpg$  and reviewed with a high frequency filter of $RemoveB'70Hz'piIHxOKA$  and a low frequency filter of $RemoveB'1Hz'XvZdodnO$ . EEG data were recorded continuously and digitally stored. Description: EEG showed continuous generalized 3 to 6 Hz theta-delta slowing. Generalized periodic discharges with triphasic morphology at 1 Hz were also noted.  EEG was reactive to noxious stimulation.  Hyperventilation and photic stimulation were not performed.   ABNORMALITY - Periodic discharges with triphasic morphology, generalized ( GPDs) - Continuous slow, generalized IMPRESSION: This study is suggestive of severe diffuse encephalopathy, nonspecific etiology but likely related to toxic-metabolic etiology, anoxic/hypoxic brain injury. No seizures or definite epileptiform discharges were seen throughout the recording. Lora Havens   ECHOCARDIOGRAM COMPLETE  Result Date: 06/16/2021    ECHOCARDIOGRAM REPORT   Patient Name:   Sonia Howard Date of Exam: 06/16/2021 Medical Rec #:  [id number]      Height:       68.0 in Accession #:    [id number]     Weight:       116.2 lb Date of Birth:  Nov 26, 1981  BSA:          1.622 m Patient  Age:    39 years       BP:           124/106 mmHg Patient Gender: F              HR:           100 bpm. Exam Location:  Inpatient Procedure: 2D Echo, Cardiac Doppler and Color Doppler Indications:    R94.31 Abnormal EKG  History:        Patient has no prior history of Echocardiogram examinations.                 IVDU. Sepsis. Respiratory failure.  Sonographer:    Merrie Roof RDCS Referring Phys: [id number] [place]  1. Left ventricular ejection fraction, by estimation, is 30 to 35%. The left ventricle has moderately decreased function. The left ventricle demonstrates regional wall motion abnormalities. The basal segments of the LV are severely hypokinetic. The mid-apical segments function normally. ?Reverse Takotsubo picture. The left ventricular internal cavity size was mildly dilated. Left ventricular diastolic parameters are indeterminate.  2. Right ventricular systolic function is mildly reduced. The right ventricular size is normal. Tricuspid regurgitation signal is inadequate for assessing PA pressure.  3. Left atrial size was moderately dilated.  4. The mitral valve is normal in structure. Trivial mitral valve regurgitation. No evidence of mitral stenosis.  5. The aortic valve is tricuspid. Aortic valve regurgitation is not visualized. No aortic stenosis is present.  6. The inferior vena cava is normal in size with greater than 50% respiratory variability, suggesting right atrial pressure of 3 mmHg.  7. A small pericardial effusion is present. FINDINGS  Left Ventricle: Left ventricular ejection fraction, by estimation, is 30 to 35%. The left ventricle has moderately decreased function. The left ventricle demonstrates regional wall motion abnormalities. The left ventricular internal cavity size was mildly dilated. There is no left ventricular hypertrophy. Left ventricular diastolic parameters are indeterminate. Right Ventricle: The right ventricular size is normal. No increase in right  ventricular wall thickness. Right ventricular systolic function is mildly reduced. Tricuspid regurgitation signal is inadequate for assessing PA pressure. Left Atrium: Left atrial size was moderately dilated. Right Atrium: Right atrial size was normal in size. Pericardium: A small pericardial effusion is present. Mitral Valve: The mitral valve is normal in structure. Trivial mitral valve regurgitation. No evidence of mitral valve stenosis. Tricuspid Valve: The tricuspid valve is normal in structure. Tricuspid valve regurgitation is not demonstrated. Aortic Valve: The aortic valve is tricuspid. Aortic valve regurgitation is not visualized. No aortic stenosis is present. Pulmonic Valve: The pulmonic valve was normal in structure. Pulmonic valve regurgitation is trivial. Aorta: The aortic root is normal in size and structure. Venous: The inferior vena cava is normal in size with greater than 50% respiratory variability, suggesting right atrial pressure of 3 mmHg. IAS/Shunts: No atrial level shunt detected by color flow Doppler.  LEFT VENTRICLE PLAX 2D LVIDd:         5.10 cm LVIDs:         4.50 cm LV PW:         0.90 cm LV IVS:        0.70 cm LVOT diam:     2.00 cm LVOT Area:     3.14 cm  RIGHT VENTRICLE RV Basal diam:  3.20 cm RV Mid diam:    3.20 cm LEFT ATRIUM  Index LA diam:      3.00 cm 1.85 cm/m LA Vol (A4C): 95.9 ml 59.11 ml/m   AORTA Ao Root diam: 2.90 cm  SHUNTS Systemic Diam: 2.00 cm Loralie Champagne MD Electronically signed by Loralie Champagne MD Signature Date/Time: 06/16/2021/4:48:05 PM    Final    CT Maxillofacial Wo Contrast  Result Date: 06/15/2021 CLINICAL DATA:  Found on the ground.  Intoxicated. EXAM: CT HEAD WITHOUT CONTRAST CT MAXILLOFACIAL WITHOUT CONTRAST CT CERVICAL SPINE WITHOUT CONTRAST TECHNIQUE: Multidetector CT imaging of the head, cervical spine, and maxillofacial structures were performed using the standard protocol without intravenous contrast. Multiplanar CT image reconstructions  of the cervical spine and maxillofacial structures were also generated. COMPARISON:  None. FINDINGS: CT HEAD FINDINGS Brain: The brain shows a normal appearance without evidence of malformation, atrophy, old or acute small or large vessel infarction, mass lesion, hemorrhage, hydrocephalus or extra-axial collection. Vascular: No hyperdense vessel. No evidence of atherosclerotic calcification. Skull: Normal.  No traumatic finding.  No focal bone lesion. Sinuses/Orbits: Sinuses are clear. Orbits appear normal. Mastoids are clear. Other: None significant CT MAXILLOFACIAL FINDINGS Osseous: No evidence of facial fracture. Orbits: No orbital injury. Sinuses: Some mucosal thickening and fluid of the right maxillary sinus. Other sinuses are clear. Soft tissues: No soft tissue face abnormality of significance. Endotracheal tube and orogastric tube in place. CT CERVICAL SPINE FINDINGS Alignment: No malalignment. Skull base and vertebrae: No fracture or focal bone lesion. Soft tissues and spinal canal: No soft tissue neck lesion identified. Endotracheal tube and orogastric tube in place. Disc levels: No degenerative disc disease. No bony stenosis of the canal or foramina. Upper chest: Septal thickening in the upper lungs could reflect early edema. Other: None IMPRESSION: Head CT: Normal Maxillofacial CT: No traumatic finding. Some mucosal inflammatory disease of the right maxillary sinus. Cervical spine CT: No traumatic finding.  Normal. Endotracheal tube and orogastric tube in place. Septal thickening in the upper lungs consistent with pulmonary edema. Electronically Signed   By: Nelson Chimes M.D.   On: 06/15/2021 15:35    Cardiac Studies   Echo on 06/16/21:  1. Left ventricular ejection fraction, by estimation, is 30 to 35%. The  left ventricle has moderately decreased function. The left ventricle  demonstrates regional wall motion abnormalities. The basal segments of the  LV are severely hypokinetic. The mid-apical  segments function normally. ?Reverse Takotsubo picture. The  left ventricular internal cavity size was mildly dilated. Left ventricular diastolic parameters are indeterminate.   2. Right ventricular systolic function is mildly reduced. The right  ventricular size is normal. Tricuspid regurgitation signal is inadequate  for assessing PA pressure.   3. Left atrial size was moderately dilated.   4. The mitral valve is normal in structure. Trivial mitral valve  regurgitation. No evidence of mitral stenosis.   5. The aortic valve is tricuspid. Aortic valve regurgitation is not  visualized. No aortic stenosis is present.   6. The inferior vena cava is normal in size with greater than 50%  respiratory variability, suggesting right atrial pressure of 3 mmHg.   7. A small pericardial effusion is present.   Patient Profile     Jaelani Posa is a 40 y.o. female with a hx of migraine, anxiety, depression, hypothyroidism, iron deficiency anemia, RLS, GERD, hx of overdose with opioid, tobacco abuse, hx of lap vertical sleeve gastrectomy in 2015, who is being seen 06/15/2021 for the evaluation of elevated trop at the request of Dr. Laverta Baltimore.   Assessment & Plan  Elevated Trop Cardiomyopathy Acute systolic heart failure  - EKG without acute ischemic findings, rate related ST depression  - Hs Trop 2116 >2252>1413 - BNP 1055  - Echo from 7/7 showed EF 30-35%, basal segments of the LV are severely hypokinetic.?Reverse Takotsubo picture. RV mildly reduced, moderate dilated LA, trivial MR, a small pericardial effusion - UOP 3192 ml over the past 24 hours - caution with fluid bolus  - suspect demand ischemia and stress induced CM,  cardiac cath can be considered for ischemic evaluation if patient demonstrate meaningful neurological recovery, can likely stop heparin gtt today/will discuss with MD   Acute metabolic encephalopathy - etiology unclear, hx of IVDU, drug overdose with opioid and with psychological  issues - urine tox clean here,  ETOH/Salicylate negative; CK 546 mild rabdo; Bcx NTD; TSH/FT4/ammonia WNL; HIV negative. EEG showed severe diffuse encephalopathy, nonspecific etiology but likely related to toxic-metabolic etiology, anoxic/hypoxic brain injury. No seizures or definite epileptiform discharge; MRI showed No evidence of acute intracranial abnormality. AM cortisol WNL - query Wernicke's encephalopathy, agree with trial thiamine - monitor neuro exam, no improvement thus far, care per PCCM    Ventilator dependent respiratory failure  - due to encephalopathy - on mechanical ventilation - care per PCCM   Presumed aspiration pneumonia - chest imaging concern for aspiration pneumonia - monitor for Bcx, antigen study, and fever/hypoxia , on empiric antibiotic   Hypokalemia Hypophosphatemia  -replace per ICU, monitor for re-feeding syndrome while on feeds    Recurrent hypoglycemia - pending  endocrinology workup  - on EN tube feeds now    Leukopenia, resolved Hypothermia, resolved  Sinus tachycardia, resolved  - lactic acid WNL, pending infectious work up, will defer to PCCM    Hypoalbuminemia BLE edema Cachexia -  suspect chronic poor nutrition intake based on clinical exam -  TF started per PCCM    Polysubstance abuse Hypothyroidism Migraine Anxiety/depression GERD - defer to PCCM      For questions or updates, please contact [facility] Please consult [email or web address] for contact info under   Signed, Margie Billet, NP  06/17/2021, 7:03 AM   As above, patient seen and examined.  She remains intubated and unresponsive.  I discussed patient with Dr. Tamala Julian and plan is to follow over the weekend and if neurological status does not improve potentially withdrawal care.  Echocardiogram shows ejection fraction 30 to 35%, moderate left atrial enlargement and small pericardial effusion.  We could consider further cardiac evaluation in the future if she improves.  However this  seems unlikely.  IV heparin can be discontinued from a cardiac standpoint.  We will see again on Monday.  Please call over the weekend with questions.  Kirk Ruths MD

## 2021-06-17 NOTE — Progress Notes (Signed)
LTM EEG video. Started notified neuro.

## 2021-06-17 NOTE — Procedures (Addendum)
Cortrak  Person Inserting Tube:  Sonia Howard, RD Tube Type:  Cortrak - 43 inches Tube Size:  10 Tube Location:  Left nare Initial Placement:  Stomach Secured by: Bridle Technique Used to Measure Tube Placement:  Marking at nare/corner of mouth Cortrak Secured At:  87 cm  Cortrak Tube Team Note:  Consult received to place a Cortrak feeding tube.   X-ray is required, abdominal x-ray has been ordered by the Cortrak team. Please confirm tube placement before using the Cortrak tube.   If the tube becomes dislodged please keep the tube and contact the Cortrak team at [email or web address] (password [email or web address]) for replacement.  If after hours and replacement cannot be delayed, place a NG tube and confirm placement with an abdominal x-ray.    Vanessa Kick MS, RD, LDN, CNSC Clinical Nutrition Pager listed in AMION

## 2021-06-17 NOTE — Progress Notes (Signed)
   NAME:  Sonia Howard, MRN:  [id number], DOB:  08-Jan-1981, LOS: 2 ADMISSION DATE:  06/15/2021, CONSULTATION DATE: 06/15/2021 REFERRING MD:  Maia Plan, MD , CHIEF COMPLAINT: Found unresponsive  History of Present Illness:  Patient is intubated and unresponsive, most of the history is taken from chart review  40 year old female with history of drug overdose and unintentional weight loss who was brought into the emergency department after she was found unresponsive at her friend's house, her last known well was 5 PM yesterday.  Patient's vital signs were stable per EMS report but she was not responsive, her blood sugar was 23, she was given 2 A of D50 with that blood sugar improved to 250, she was given 2 mg of Narcan with no response.  Initially she received bag valve mask ventilation, in the emergency department she was intubated due to her GCS score being 3, in the emergency department patient blood sugar again was noted to be 12, she received D50 x2 and was started on D10. PCCM was consulted for help with evaluation and management  Pertinent  Medical History  Unintentional weight loss   Significant Hospital Events: Including procedures, antibiotic start and stop dates in addition to other pertinent events   7/6 admitted and intubated  Interim History / Subjective:  Remains comatose.  Objective   Blood pressure 109/67, pulse 83, temperature (!) 96.9 F (36.1 C), temperature source Axillary, resp. rate (!) 25, height 5\' 8"  (1.727 m), weight 53.8 kg, SpO2 100 %.    Vent Mode: PRVC FiO2 (%):  [40 %-60 %] 60 % Set Rate:  [18 bmp] 18 bmp Vt Set:  [510 mL] 510 mL PEEP:  [5 cmH20-10 cmH20] 10 cmH20 Plateau Pressure:  [20 cmH20-22 cmH20] 22 cmH20   Intake/Output Summary (Last 24 hours) at 06/17/2021 0725 Last data filed at 06/17/2021 0600 Gross per 24 hour  Intake 3192.3 ml  Output 700 ml  Net 2492.3 ml    Filed Weights   06/15/21 1600 06/16/21 0451 06/17/21 0407  Weight: 60 kg 52.7  kg 53.8 kg    Examination: Pupils dilated but reactive Has cough/gag, triggers vent GCS3 otherwise Lungs clear +LE edema  K/Phos being repleted WBC improved  Resolved Hospital Problem list     Assessment & Plan:  Coma- exam highly concerning for profound neurological injury.  Found hypoglycemic, initial CT head neg.  MRI unrevealing.  Shock, troponin leak, abnormal bedside echo- formal echo more c/w stress cardiomyopathy; given uncertain neurological prognosis, no role for LHC at this time; cardiology following Abnormal CXR- edema vs. Aspiration Acute hypoxemic respiratory failure- due to bilateral airspace disease Hx IVDA Leukopenia  Overall most c/w overdose with seizure and aspiration. Ceftriaxone x 7 days Stop heparin drip as no plans for cath LTVEEG Will have neurology come to help with prognostication which will be tricky Family is realistic, understand there is a good chance we will not have a good outcome here  Best Practice (right click and "Reselect all SmartList Selections" daily)   Diet/type: TF DVT prophylaxis: heparin GI prophylaxis: PPI Lines: foley, central line Code Status:  full code Last date of multidisciplinary goals of care discussion- 7/7, get more info  35 min CC time independent of procedures 9/7 MD PCCM

## 2021-06-17 NOTE — Progress Notes (Signed)
[facility] ADULT ICU REPLACEMENT PROTOCOL   The patient does apply for the [facility] Adult ICU Electrolyte Replacment Protocol based on the criteria listed below:   1. Is GFR >/= 30 ml/min? Yes.    Patient's GFR today is >60 2. Is SCr </= 2? Yes.   Patient's SCr is 0.52 ml/kg/hr 3. Did SCr increase >/= 0.5 in 24 hours? No. 4. Abnormal electrolyte(s):   K 3.3, Phos 1.7 5. Ordered repletion with: protocol 6. If a panic level lab has been reported, has the CCM MD in charge been notified?  Physician:  Ferd Hibbs R Shannon Kirkendall 06/17/2021 5:30 AM

## 2021-06-17 NOTE — Progress Notes (Signed)
ANTICOAGULATION CONSULT NOTE - Follow Up Consult  Pharmacy Consult:  IV Heparin Indication: chest pain/ACS  Allergies: Unable to Obtain (Unresponsive)  Patient Measurements: Height: 5\' 8"  (172.7 cm) Weight: 52.7 kg (116 lb 2.9 oz) IBW/kg (Calculated) : 63.9 Heparin Dosing Weight: 52.7 kg  Vital Signs: Temp: 98.6 F (37 C) (07/07 2332) Temp Source: Axillary (07/07 2332) BP: 96/68 (07/07 2300) Pulse Rate: 82 (07/07 2300)  Labs: Recent Labs    06/15/21 1322 06/15/21 1621 06/16/21 0034 06/16/21 0331 06/16/21 0804 06/16/21 1512 06/16/21 2300  HGB 12.9 13.9 12.6 11.2*  --   --   --   HCT 42.5 41.0 40.7 33.0*  --   --   --   PLT 218  --  190  --   --   --   --   LABPROT 13.7  --   --   --   --   --   --   INR 1.1  --   --   --   --   --   --   HEPARINUNFRC  --   --   --   --   --  0.10* 0.12*  CREATININE 0.80  --  0.75  --   --   --   --   CKTOTAL  --   --  546*  --   --   --   --   TROPONINIHS 2,116*  --  2,252*  --  1,413*  --   --     Estimated Creatinine Clearance: 77.8 mL/min (by C-G formula based on SCr of 0.75 mg/dL).  Medical History: No past medical history on file.  Assessment: 40 yr old female was found unresponsive with severe hypoglycemia and elevated troponin (2116 > 2252 > 1413).  Pharmacy was consulted to start IV heparin for rule out ACS.  Patient received heparin SQ this AM at 0619 AM.  No bleeding reported.  Heparin level of 0.12 is subtherapeutic on heparin 800 units/hr is subtherapeutic (level drawn ~30 minutes early). Per RN, heparin infusion as off for ~30 minutes to 1 hr in MRI at around 1700-1800. No other issues with IV access or infusion. No bleeding noted.   Goal of Therapy:  Heparin level 0.3-0.7 units/ml Monitor platelets by anticoagulation protocol: Yes   Plan:  Heparin 1200 unit bolus x1 Increase heparin to 950 units/hr  Check 6 hr heparin level  Monitor daily heparin level, CBC Monitor for bleeding  11-22-1981, PharmD Clinical  Pharmacist  06/17/2021, 12:41 AM

## 2021-06-17 NOTE — TOC Initial Note (Signed)
Transition of Care [facility]) - Initial/Assessment Note    Patient Details  Name: Sonia Howard MRN: [id number] Date of Birth: 1981-07-04  Transition of Care [facility] CM/SW Contact:    Lockie Pares, RN Phone Number: 06/17/2021, 10:23 AM  Clinical Narrative:                  40 year old female in with unresponsiveness found at friends home. Intubated in the ED for airway protection. BS low, narcan illicited no response. Neurology is consulted. EEG shows severe encephalopathy, indicative of hypoxia anoxic injury. Patient has poor prognosis. CM will follow for any needs    Barriers to Discharge: Continued Medical Work up   Patient Goals and CMS Choice        Expected Discharge Plan and Services         Living arrangements for the past 2 months: Single Family Home                                      Prior Living Arrangements/Services Living arrangements for the past 2 months: Single Family Home   Patient language and need for interpreter reviewed:: Yes        Need for Family Participation in Patient Care: Yes (Comment) Care giver support system in place?: Yes (comment)   Criminal Activity/Legal Involvement Pertinent to Current Situation/Hospitalization: No - Comment as needed  Activities of Daily Living      Permission Sought/Granted                  Emotional Assessment   Attitude/Demeanor/Rapport: Unresponsive Affect (typically observed): Unable to Assess   Alcohol / Substance Use: Illicit Drugs Psych Involvement: No (comment)  Admission diagnosis:  Acute respiratory failure with hypoxia (HCC) [J96.01] Acute encephalopathy [G93.40] Patient Active Problem List   Diagnosis Date Noted   Pressure injury of skin 06/16/2021   Acute encephalopathy 06/15/2021   PCP:  No primary care provider on file. Pharmacy:  No Pharmacies Listed    Social Determinants of Health (SDOH) Interventions    Readmission Risk Interventions No flowsheet data  found.

## 2021-06-18 ENCOUNTER — Inpatient Hospital Stay (HOSPITAL_COMMUNITY): Payer: Medicaid Other

## 2021-06-18 LAB — COMPREHENSIVE METABOLIC PANEL
ALT: 33 U/L (ref 0–44)
AST: 28 U/L (ref 15–41)
Albumin: 1.6 g/dL — ABNORMAL LOW (ref 3.5–5.0)
Alkaline Phosphatase: 70 U/L (ref 38–126)
Anion gap: 6 (ref 5–15)
BUN: 6 mg/dL (ref 6–20)
CO2: 31 mmol/L (ref 22–32)
Calcium: 7.7 mg/dL — ABNORMAL LOW (ref 8.9–10.3)
Chloride: 102 mmol/L (ref 98–111)
Creatinine, Ser: 0.33 mg/dL — ABNORMAL LOW (ref 0.44–1.00)
GFR, Estimated: >60 mL/min (ref >60)
Glucose, Bld: 117 mg/dL — ABNORMAL HIGH (ref 70–99)
Potassium: 3.2 mmol/L — ABNORMAL LOW (ref 3.5–5.1)
Sodium: 139 mmol/L (ref 135–145)
Total Bilirubin: 0.4 mg/dL (ref 0.3–1.2)
Total Protein: 4.9 g/dL — ABNORMAL LOW (ref 6.5–8.1)

## 2021-06-18 LAB — CBC
HCT: 30.3 % — ABNORMAL LOW (ref 36.0–46.0)
Hemoglobin: 9.6 g/dL — ABNORMAL LOW (ref 12.0–15.0)
MCH: 27.7 pg (ref 26.0–34.0)
MCHC: 31.7 g/dL (ref 30.0–36.0)
MCV: 87.6 fL (ref 80.0–100.0)
Platelets: 123 10*3/uL — ABNORMAL LOW (ref 150–400)
RBC: 3.46 MIL/uL — ABNORMAL LOW (ref 3.87–5.11)
RDW: 22.2 % — ABNORMAL HIGH (ref 11.5–15.5)
WBC: 6.7 10*3/uL (ref 4.0–10.5)
nRBC: 0 % (ref 0.0–0.2)

## 2021-06-18 LAB — PHOSPHORUS: Phosphorus: 3 mg/dL (ref 2.5–4.6)

## 2021-06-18 LAB — GLUCOSE, CAPILLARY
Glucose-Capillary: 124 mg/dL — ABNORMAL HIGH (ref 70–99)
Glucose-Capillary: 102 mg/dL — ABNORMAL HIGH (ref 70–99)
Glucose-Capillary: 93 mg/dL (ref 70–99)
Glucose-Capillary: 91 mg/dL (ref 70–99)
Glucose-Capillary: 125 mg/dL — ABNORMAL HIGH (ref 70–99)
Glucose-Capillary: 134 mg/dL — ABNORMAL HIGH (ref 70–99)

## 2021-06-18 IMAGING — DX DG ABDOMEN 1V
1 series · 1 of 1 positions shown · non-contrast
Comparison: [DATE]

CLINICAL DATA: OG tube placement

EXAM:
ABDOMEN - 1 VIEW

[abdomen]
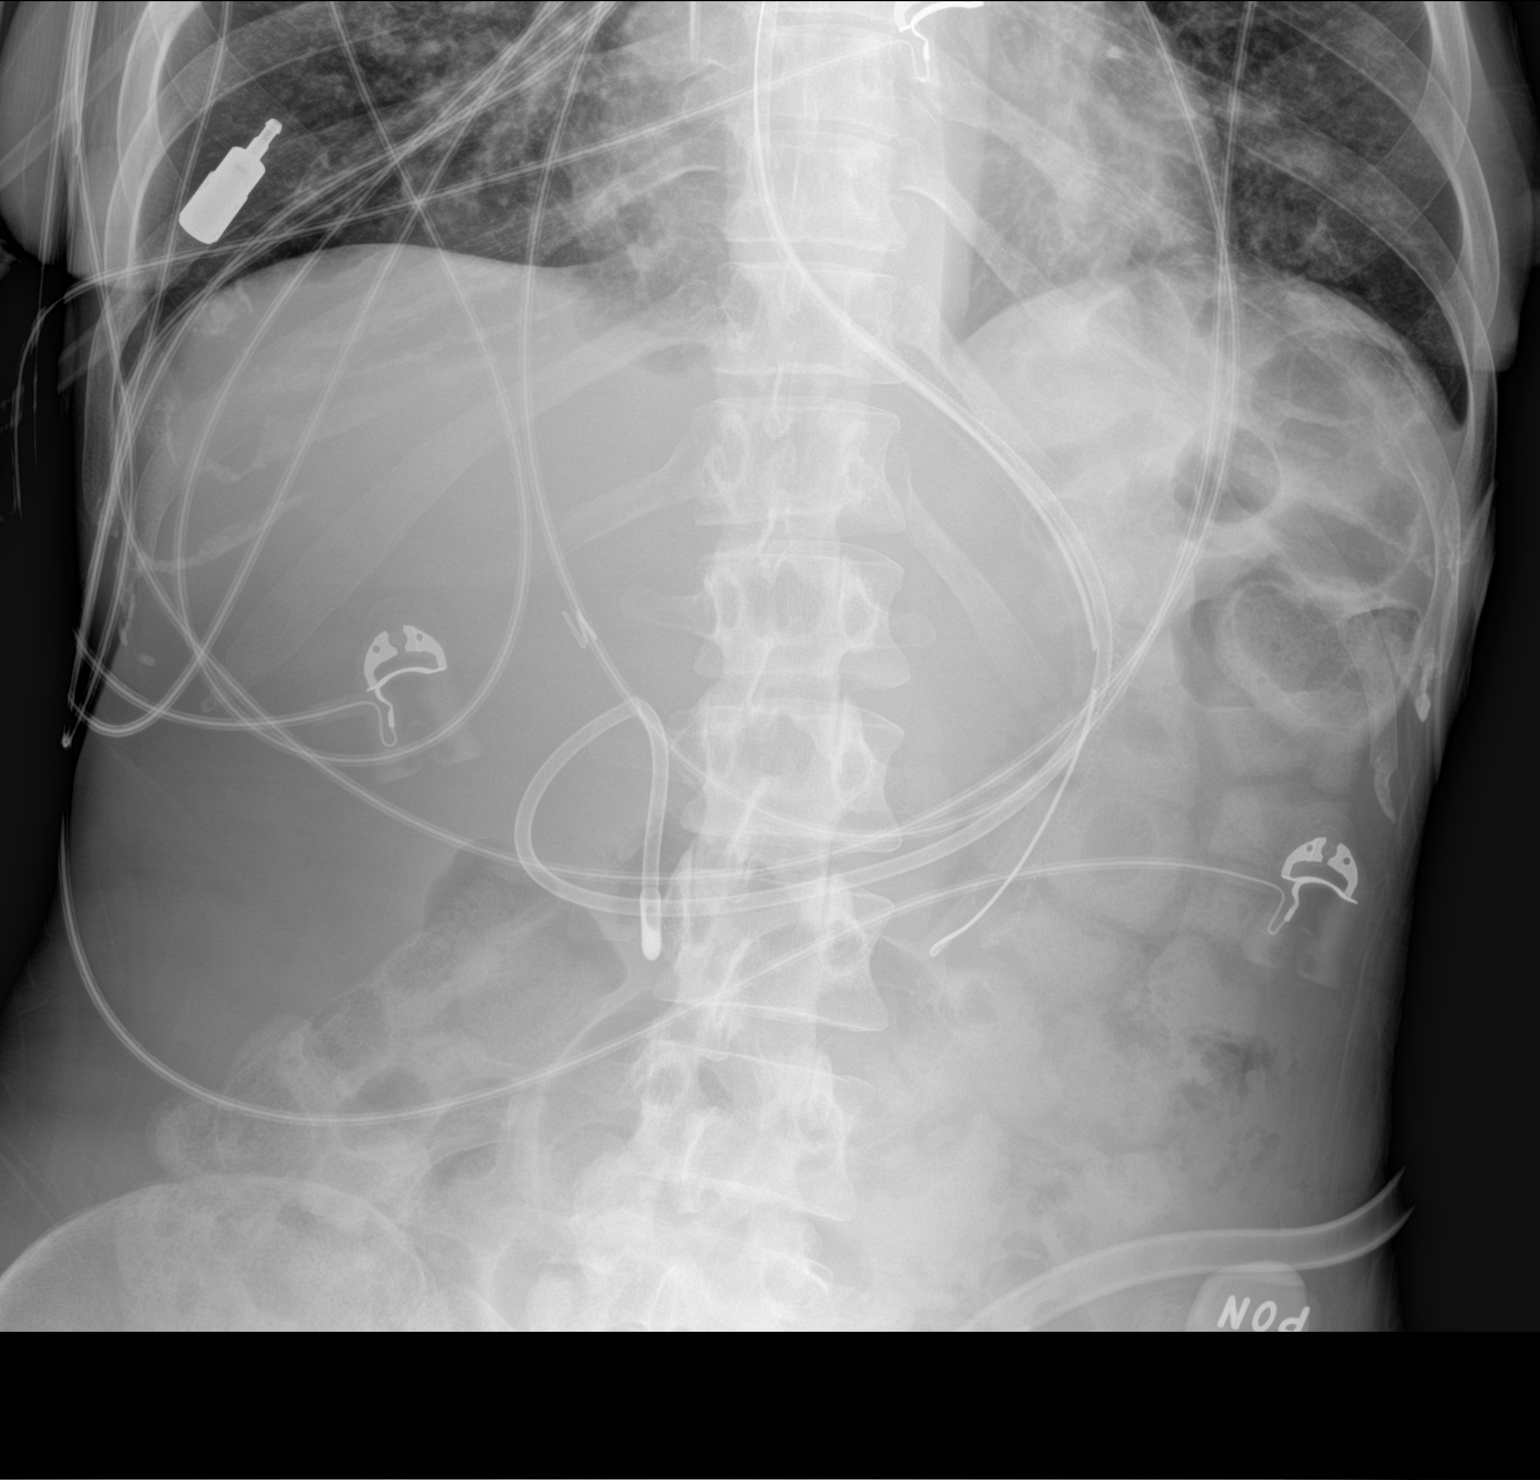

[1 of 1 positions shown; findings below may reference images not displayed]

FINDINGS: Feeding tube is in place in the descending duodenum. NG tube tip is
in the mid stomach. Nonobstructive bowel gas pattern. No
organomegaly or free air.
IMPRESSION: Feeding tube in the descending duodenum and NG tube in the stomach.

## 2021-06-18 MED ORDER — POTASSIUM CHLORIDE 10 MEQ/50ML IV SOLN
10.0000 meq | INTRAVENOUS | Status: AC
  Administered 2021-06-18 (×6): 10 meq via INTRAVENOUS
  Filled 2021-06-18 (×6): qty 50

## 2021-06-18 MED ORDER — POTASSIUM CHLORIDE 10 MEQ/50ML IV SOLN
10.0000 meq | INTRAVENOUS | Status: DC
Start: 2021-06-18 — End: 2021-06-18

## 2021-06-18 MED ORDER — MAGNESIUM SULFATE 2 GM/50ML IV SOLN
2.0000 g | Freq: Once | INTRAVENOUS | Status: AC
  Administered 2021-06-18: 2 g via INTRAVENOUS
  Filled 2021-06-18: qty 50

## 2021-06-18 MED ORDER — POTASSIUM CHLORIDE 20 MEQ PO PACK: 20.0000 meq | PACK | ORAL | Status: DC

## 2021-06-18 NOTE — Progress Notes (Signed)
Subjective: No significant changes  Exam: Vitals:   06/18/21 0741 06/18/21 0745  BP:  135/84  Pulse: 95 (!) 101  Resp: (!) 23 (!) 22  Temp:    SpO2: 99% 99%   Gen: In bed, NAD Resp: non-labored breathing, no acute distress Abd: soft, nt  Neuro: MS: does not open eyes or follow commands [id number]:[id number], corneals intact, does not blink ot threat or orient eyes to voice.  Motor: withdraws to noxious stimulation.  Sensory:as above  Pertinent Labs: Albumin 1.6   Impression: 40 year old female with prolonged downtime with severe hypoglycemia.  Prognosis in the setting of hypoglycemic brain injury can symptoms be very difficult, but the fact that she already has signs of cortical function (withdraw from pain) is good.  She has generalized periodic discharges on EEG, but these do not appear to be ictal in nature.  Given their monomorphic nature, I think these can be followed with spot EEGs on an as-needed basis rather than continued long-term monitoring.  Recommendations: 1) discontinue EEG monitoring 2) neurology will continue to follow  Ritta Slot, MD [facility] [phone]  If 7pm- 7am, please page neurology on call as listed in AMION.

## 2021-06-18 NOTE — Progress Notes (Signed)
NAME:  Sonia Howard, MRN:  [id number], DOB:  13-Jan-1981, LOS: 3 ADMISSION DATE:  06/15/2021, CONSULTATION DATE: 06/15/2021 REFERRING MD:  Maia Plan, MD , CHIEF COMPLAINT: Found unresponsive  History of Present Illness:  Patient is intubated and unresponsive, most of the history is taken from chart review  40 year old female with history of drug overdose and unintentional weight loss who was brought into the emergency department after she was found unresponsive at her friend's house, her last known well was 5 PM yesterday.  Patient's vital signs were stable per EMS report but she was not responsive, her blood sugar was 23, she was given 2 A of D50 with that blood sugar improved to 250, she was given 2 mg of Narcan with no response.  Initially she received bag valve mask ventilation, in the emergency department she was intubated due to her GCS score being 3, in the emergency department patient blood sugar again was noted to be 12, she received D50 x2 and was started on D10. PCCM was consulted for help with evaluation and management  Pertinent  Medical History  Unintentional weight loss   Significant Hospital Events: Including procedures, antibiotic start and stop dates in addition to other pertinent events   7/6 admitted and intubated  Interim History / Subjective:   Intubate critically ill on mechanical support. Comatose.   Objective   Blood pressure 135/84, pulse (!) 101, temperature 98.7 F (37.1 C), temperature source Axillary, resp. rate (!) 22, height 5\' 8"  (1.727 m), weight 55.5 kg, SpO2 99 %.    Vent Mode: PRVC FiO2 (%):  [40 %-50 %] 40 % Set Rate:  [18 bmp] 18 bmp Vt Set:  [510 mL] 510 mL PEEP:  [5 cmH20-8 cmH20] 5 cmH20 Plateau Pressure:  [10 cmH20-30 cmH20] 30 cmH20   Intake/Output Summary (Last 24 hours) at 06/18/2021 0754 Last data filed at 06/18/2021 0600 Gross per 24 hour  Intake 1593.08 ml  Output 1390 ml  Net 203.08 ml   Filed Weights   06/16/21 0451 06/17/21  0407 06/18/21 0500  Weight: 52.7 kg 53.8 kg 55.5 kg    Examination: General appearance: 40 y.o., female remains intubated, critically ill HENT: ncat, ETT in place  Neck: trachea midline, CVL Lungs: BL vented breaths  CV: RRR, s1 s2  Abdomen: soft, nt nd  Extremities: no peripheral edema  Skin: no rash  Neuro: sedate, on precedex, responds to pain, nothing purposeful   Labs reviewed:  Sodium 139 K 3.2 WBC 6.7 PLT 123  Resolved Hospital Problem list     Assessment & Plan:   Coma, prolonged down time, possible anoxic brain injury, presumed drug overdose- exam highly concerning for profound neurological injury.  Found hypoglycemic, initial CT head neg.  MRI unrevealing. EEG no seizure Shock, troponin leak, possible stress induced cardiomyopathy- formal echo more c/w stress cardiomyopathy; given uncertain neurological prognosis, no role for LHC at this time; cardiology following Aspiration pneumonia, bilateral lower lobes  Acute hypoxemic respiratory failure requiring intubation and mechanical ventilation  Known Hx of IVDA Leukopenia  Plan:  Sedation with precedex Try to avoid benzos Prn opiates ok Ceftriaxone X 7 days  Complete heparin, no LHC plans  LTVEEG per neurology  I think she needs more time. She seems to be responding and moving more today per bed nursing.    Best Practice (right click and "Reselect all SmartList Selections" daily)   Diet/type: TF DVT prophylaxis: heparin GI prophylaxis: PPI Lines: foley, central line Code Status:  full code  Last date of multidisciplinary goals of care discussion- 7/7, get more info  This patient is critically ill with multiple organ system failure; which, requires frequent high complexity decision making, assessment, support, evaluation, and titration of therapies. This was completed through the application of advanced monitoring technologies and extensive interpretation of multiple databases. During this encounter critical  care time was devoted to patient care services described in this note for 32 minutes.  Josephine Igo, DO [facility] 06/18/2021 10:03 AM

## 2021-06-18 NOTE — Procedures (Addendum)
Patient Name: Sonia Howard  MRN: [id number]  Epilepsy Attending: Charlsie Quest  Referring Physician/Provider: Dr. Levon Hedger Duration: 06/17/2021 1010 to 06/18/2021 1141   Patient history: 40 year old female with history of drug overdose and unintentional weight loss brought in after she was found unresponsive at a friend's home.  Blood sugar was 23.  EEG to evaluate for seizures.   Level of alertness: Comatose   AEDs during EEG study: None   Technical aspects: This EEG study was done with scalp electrodes positioned according to the 10-20 International system of electrode placement. Electrical activity was acquired at a sampling rate of 500Hz  and reviewed with a high frequency filter of 70Hz  and a low frequency filter of 1Hz . EEG data were recorded continuously and digitally stored.   Description: EEG showed continuous generalized 3 to 6 Hz theta-delta slowing. Generalized periodic discharges with triphasic morphology at 1-1.5 Hz were also noted.  EEG was reactive to noxious stimulation.  Hyperventilation and photic stimulation were not performed.      ABNORMALITY - Periodic discharges with triphasic morphology, generalized ( GPDs) - Continuous slow, generalized   IMPRESSION: This study is suggestive of severe diffuse encephalopathy, nonspecific etiology but likely related to toxic-metabolic etiology, anoxic/hypoxic brain injury. No seizures or definite epileptiform discharges were seen throughout the recording.   Jon Kasparek 

## 2021-06-18 NOTE — Progress Notes (Signed)
Foley removed on 3/922 at 1600 .Patient has had two I and O caths for urinary retention. She now has in the bladder and we are approaching 24 hours without patient urinating therefor foley was placed per protocol.

## 2021-06-18 NOTE — Progress Notes (Signed)
AM K+ 3.2 with Creat 0.33 and GFR > 60. CCM ELink electrolyte protocol initiated.

## 2021-06-18 NOTE — Progress Notes (Signed)
Pharmacy Electrolyte Replacement  Recent Labs:  Recent Labs    06/17/21 2024 06/18/21 0402  K  --  3.2*  MG 1.7  --   PHOS 1.4* 3.0  CREATININE  --  0.33*    Low Critical Values (K </= 2.5, Phos </= 1, Mg </= 1) Present: None  MD Contacted: n/a  Plan: K replaced this AM. Magnesium of 1.7 from 7/8 not yet replaced - add Magnesium 2g IV x1.   Link Snuffer, PharmD, BCPS, BCCCP Clinical Pharmacist Please refer to [facility] for [facility] Pharmacy numbers 06/18/2021, 11:33 AM

## 2021-06-18 NOTE — Progress Notes (Signed)
LTM EEG discontinued - no skin breakdown at unhook.   

## 2021-06-19 ENCOUNTER — Inpatient Hospital Stay (HOSPITAL_COMMUNITY): Payer: Medicaid Other

## 2021-06-19 DIAGNOSIS — T50904S Poisoning by unspecified drugs, medicaments and biological substances, undetermined, sequela: Secondary | ICD-10-CM

## 2021-06-19 LAB — CBC WITH DIFFERENTIAL/PLATELET
Abs Immature Granulocytes: 0.03 10*3/uL (ref 0.00–0.07)
Basophils Absolute: 0 10*3/uL (ref 0.0–0.1)
Basophils Relative: 0 %
Eosinophils Absolute: 0 10*3/uL (ref 0.0–0.5)
Eosinophils Relative: 0 %
HCT: 29.7 % — ABNORMAL LOW (ref 36.0–46.0)
Hemoglobin: 9.5 g/dL — ABNORMAL LOW (ref 12.0–15.0)
Immature Granulocytes: 0 %
Lymphocytes Relative: 9 %
Lymphs Abs: 0.7 10*3/uL (ref 0.7–4.0)
MCH: 28.1 pg (ref 26.0–34.0)
MCHC: 32 g/dL (ref 30.0–36.0)
MCV: 87.9 fL (ref 80.0–100.0)
Monocytes Absolute: 0.5 10*3/uL (ref 0.1–1.0)
Monocytes Relative: 6 %
Neutro Abs: 6.1 10*3/uL (ref 1.7–7.7)
Neutrophils Relative %: 85 %
Platelets: 143 10*3/uL — ABNORMAL LOW (ref 150–400)
RBC: 3.38 MIL/uL — ABNORMAL LOW (ref 3.87–5.11)
RDW: 21.3 % — ABNORMAL HIGH (ref 11.5–15.5)
WBC: 7.4 10*3/uL (ref 4.0–10.5)
nRBC: 0 % (ref 0.0–0.2)

## 2021-06-19 LAB — COMPREHENSIVE METABOLIC PANEL
ALT: 37 U/L (ref 0–44)
AST: 36 U/L (ref 15–41)
Albumin: 1.6 g/dL — ABNORMAL LOW (ref 3.5–5.0)
Alkaline Phosphatase: 73 U/L (ref 38–126)
Anion gap: 5 (ref 5–15)
BUN: 6 mg/dL (ref 6–20)
CO2: 32 mmol/L (ref 22–32)
Calcium: 7.7 mg/dL — ABNORMAL LOW (ref 8.9–10.3)
Chloride: 104 mmol/L (ref 98–111)
Creatinine, Ser: 0.37 mg/dL — ABNORMAL LOW (ref 0.44–1.00)
GFR, Estimated: >60 mL/min (ref >60)
Glucose, Bld: 136 mg/dL — ABNORMAL HIGH (ref 70–99)
Potassium: 3.7 mmol/L (ref 3.5–5.1)
Sodium: 141 mmol/L (ref 135–145)
Total Bilirubin: 0.4 mg/dL (ref 0.3–1.2)
Total Protein: 5.1 g/dL — ABNORMAL LOW (ref 6.5–8.1)

## 2021-06-19 LAB — GLUCOSE, CAPILLARY
Glucose-Capillary: 123 mg/dL — ABNORMAL HIGH (ref 70–99)
Glucose-Capillary: 134 mg/dL — ABNORMAL HIGH (ref 70–99)
Glucose-Capillary: 124 mg/dL — ABNORMAL HIGH (ref 70–99)
Glucose-Capillary: 136 mg/dL — ABNORMAL HIGH (ref 70–99)
Glucose-Capillary: 118 mg/dL — ABNORMAL HIGH (ref 70–99)
Glucose-Capillary: 137 mg/dL — ABNORMAL HIGH (ref 70–99)

## 2021-06-19 IMAGING — MR MR HEAD WO/W CM
13 of 15 series · 40 of 48 positions shown · IV contrast (gadavist)
Comparison: [DATE].
COMPARISON: [DATE].

Addendum:
CLINICAL DATA: Mental status change.

EXAM:
MRI HEAD WITHOUT AND WITH CONTRAST
TECHNIQUE: Multiplanar, multiecho pulse sequences of the brain and surrounding
structures were obtained without and with intravenous contrast.
CONTRAST:  5.5mL GADAVIST GADOBUTROL 1 MMOL/ML IV SOLN

[Series 5: DWI · axial · 3.0mm · 0.88mm/px · z∈[-112,+32]mm · 5 of 100 slices shown (1 of 4)]
[im 1/100]
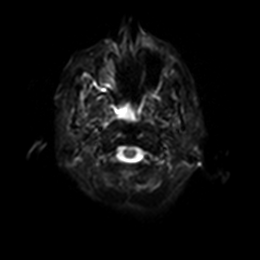
[im 25/100]
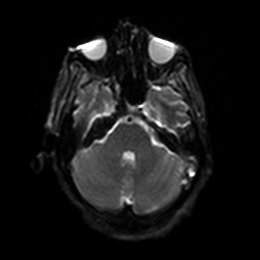
[im 50/100]
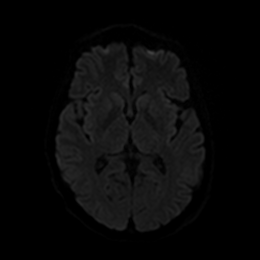
[im 75/100]
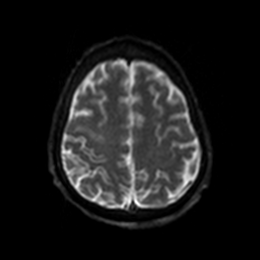
[im 100/100]
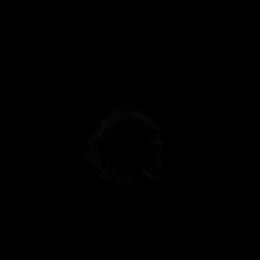

[Series 6: DWI · axial · 3.0mm · 0.88mm/px · z∈[-112,+32]mm · 3 of 47 slices shown (2 of 4)]
[im 1/47]
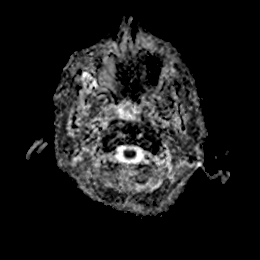
[im 24/47]
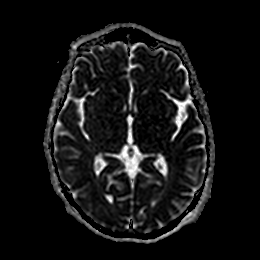
[im 47/47]
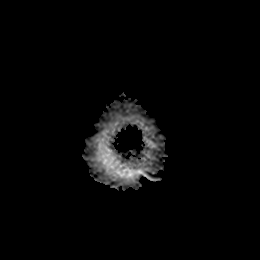

[Series 7: DWI · coronal · 4.0mm · 0.88mm/px · 5 of 70 slices shown (3 of 4)]
[im 1/70]
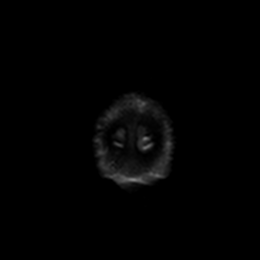
[im 18/70]
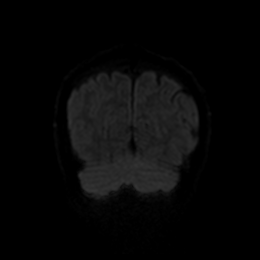
[im 35/70]
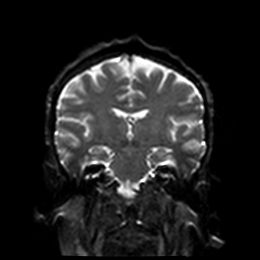
[im 52/70]
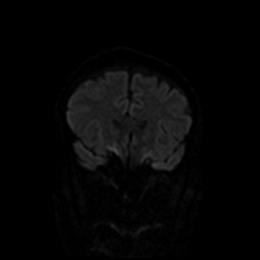
[im 70/70]
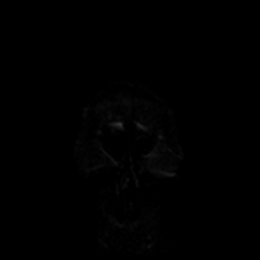

[Series 8: DWI · coronal · 4.0mm · 0.88mm/px · 2 of 35 slices shown (4 of 4)]
[im 1/35]
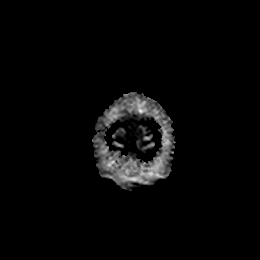
[im 35/35]
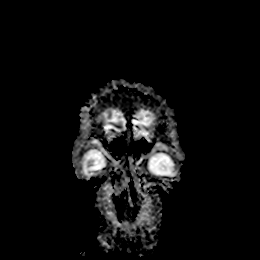

[Series 9: T1 · sagittal · 5.0mm · 0.75mm/px · 2 of 25 slices shown]
[im 1/25]
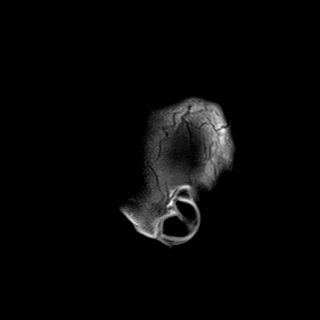
[im 25/25]
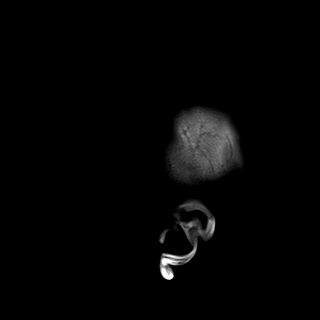

[Series 10: T2 · axial · 5.0mm · 0.72mm/px · z∈[-117,+36]mm · 2 of 27 slices shown]
[im 1/27]
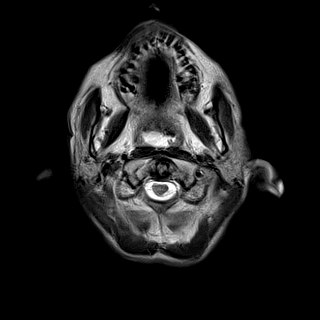
[im 27/27]
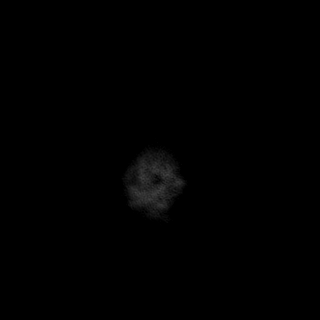

[Series 11: FLAIR · axial · 5.0mm · 0.45mm/px · z∈[-113,+40]mm · 2 of 27 slices shown]
[im 1/27]
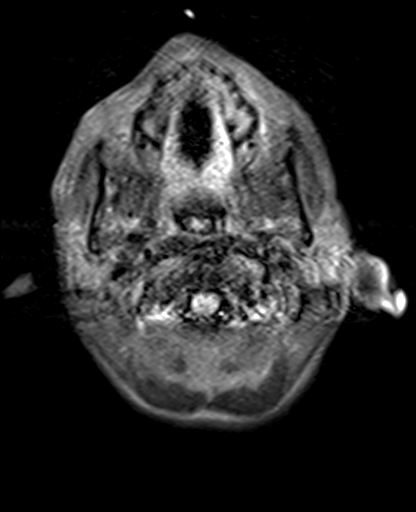
[im 27/27]
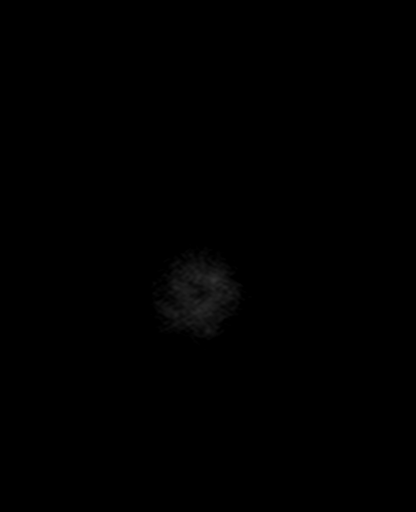

[Series 12: mag_images · axial · 3.0mm · 0.90mm/px · z∈[-123,+51]mm · 4 of 60 slices shown]
[im 1/60]
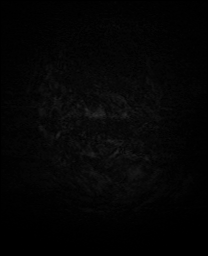
[im 20/60]
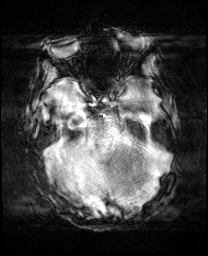
[im 40/60]
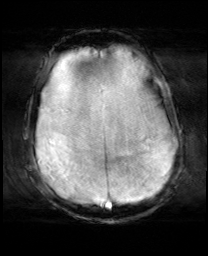
[im 60/60]
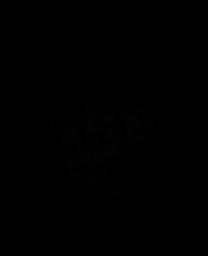

[Series 13: pha_images · axial · 3.0mm · 0.90mm/px · z∈[-123,+51]mm · 4 of 56 slices shown]
[im 1/56]
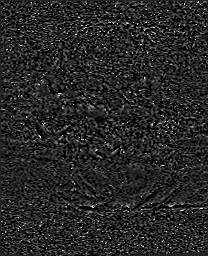
[im 19/56]
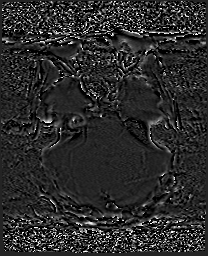
[im 37/56]
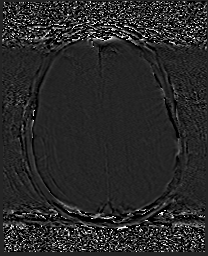
[im 56/56]
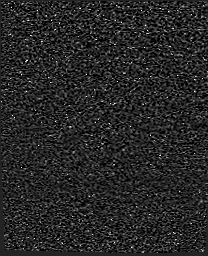

[Series 14: swi_images · axial · 3.0mm · 0.90mm/px · z∈[-123,+51]mm · 4 of 60 slices shown]
[im 1/60]
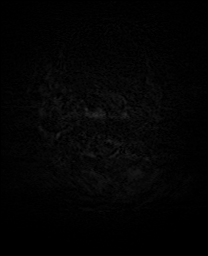
[im 20/60]
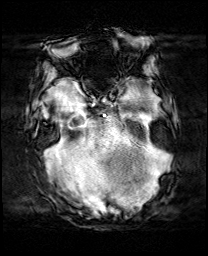
[im 40/60]
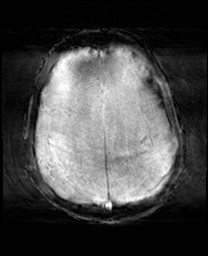
[im 60/60]
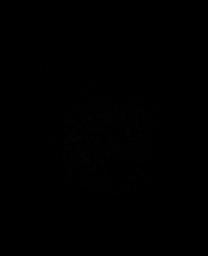

[Series 15: mip_images(sw) · axial · 24.0mm · 0.90mm/px · z∈[-113,+40]mm · 3 of 53 slices shown]
[im 1/53]
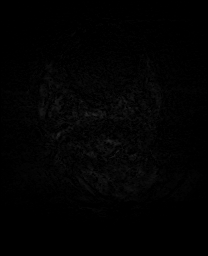
[im 27/53]
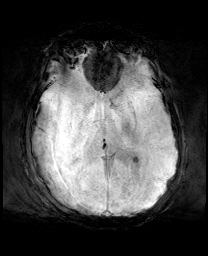
[im 53/53]
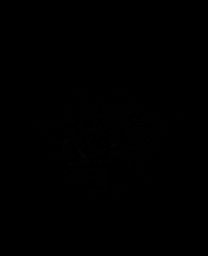

[Series 17: T2 post-contrast · coronal · 5.0mm · 0.72mm/px · 2 of 31 slices shown]
[im 1/31]
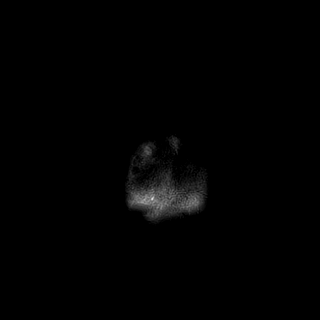
[im 31/31]
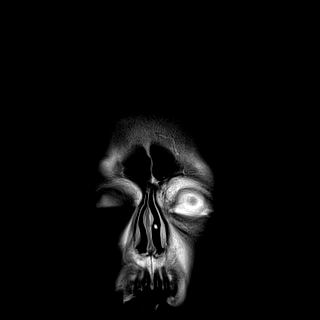

[Series 19: T1 post-contrast · coronal · 5.0mm · 0.34mm/px · 2 of 31 slices shown]
[im 1/31]
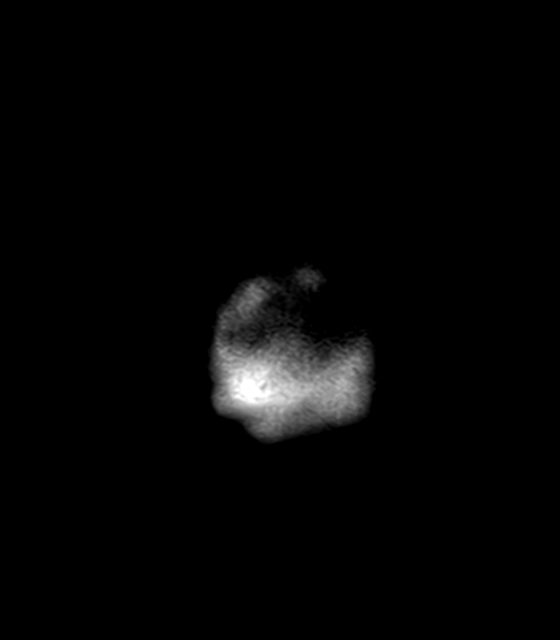
[im 31/31]
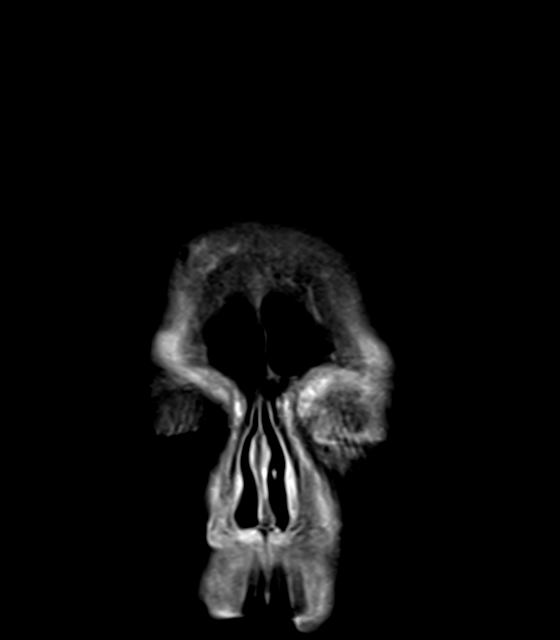

[40 of 48 positions shown; findings below may reference images not displayed]

FINDINGS: Motion limited evaluation.  Within this limitation:

Brain: No acute infarction, hydrocephalus, extra-axial collection or
mass lesion. Likely sulcal high left frontoparietal susceptibility
the recent [REDACTED] motion limited MRI. No clear signal abnormality
in this region on the other motion limited sequences. No abnormal
enhancement.

Vascular: Major arterial flow voids are maintained at the skull
base.

Skull and upper cervical spine: Normal marrow signal.

Sinuses/Orbits: Mild right maxillary sinus mucosal thickening.

Other: Trace bilateral mastoid effusions.
IMPRESSION: 1. Likely sulcal high left frontoparietal susceptibility artifact.
This could potentially represent the sequela of prior subarachnoid
hemorrhage; however, the finding was not apparent on the recent
motion limited [REDACTED] MRI. Therefore, recommend noncontrast head
CT to help exclude acute subarachnoid hemorrhage.
2. Otherwise, no evidence of acute intracranial abnormality on this
motion limited exam.
3. Mild right maxillary sinus disease with trace bilateral mastoid
effusions.

These results will be called to the ordering clinician or
representative by the Radiologist Assistant, and communication
documented in the PACS or [REDACTED].

ADDENDUM:
In addition to possible small volume subarachnoid hemorrhage,
thrombosis of a small draining cortical vein is a differential
consideration for the susceptibility artifact described in the
report. There is no clear evidence of dural sinus thrombosis on the
postcontrast imaging on this exam; however the study is limited by
motion in this region. In addition to a non-contrast head CT (to
evaluate for acute hemorrhage), a CTV may be useful to better
evaluate this area for a thrombosed draining vein.

Findings in the initial report and this addendum discussed with Dr.
ROSERO at [DATE] p.m.

*** End of Addendum ***
FINDINGS: Motion limited evaluation.  Within this limitation:

Brain: No acute infarction, hydrocephalus, extra-axial collection or
mass lesion. Likely sulcal high left frontoparietal susceptibility
the recent [REDACTED] motion limited MRI. No clear signal abnormality
in this region on the other motion limited sequences. No abnormal
enhancement.

Vascular: Major arterial flow voids are maintained at the skull
base.

Skull and upper cervical spine: Normal marrow signal.

Sinuses/Orbits: Mild right maxillary sinus mucosal thickening.

Other: Trace bilateral mastoid effusions.
IMPRESSION: 1. Likely sulcal high left frontoparietal susceptibility artifact.
This could potentially represent the sequela of prior subarachnoid
hemorrhage; however, the finding was not apparent on the recent
motion limited [REDACTED] MRI. Therefore, recommend noncontrast head
CT to help exclude acute subarachnoid hemorrhage.
2. Otherwise, no evidence of acute intracranial abnormality on this
motion limited exam.
3. Mild right maxillary sinus disease with trace bilateral mastoid
effusions.

These results will be called to the ordering clinician or
representative by the Radiologist Assistant, and communication
documented in the PACS or [REDACTED].

## 2021-06-19 MED ORDER — GADOBUTROL 1 MMOL/ML IV SOLN
5.5000 mL | Freq: Once | INTRAVENOUS | Status: AC | PRN
  Administered 2021-06-19: 5.5 mL via INTRAVENOUS

## 2021-06-19 NOTE — Progress Notes (Signed)
LTM EEG hooked up and running - no initial skin breakdown - push button tested - neuro notified. [facility] monitoring.  

## 2021-06-19 NOTE — Progress Notes (Addendum)
NAME:  Sonia Howard, MRN:  [id number], DOB:  September 05, 1981, LOS: 4 ADMISSION DATE:  06/15/2021, CONSULTATION DATE: 06/15/2021 REFERRING MD:  Maia Plan, MD , CHIEF COMPLAINT: Found unresponsive  History of Present Illness:  Patient is intubated and unresponsive, most of the history is taken from chart review  40 year old female with history of drug overdose and unintentional weight loss who was brought into the emergency department after she was found unresponsive at her friend's house, her last known well was 5 PM yesterday.  Patient's vital signs were stable per EMS report but she was not responsive, her blood sugar was 23, she was given 2 A of D50 with that blood sugar improved to 250, she was given 2 mg of Narcan with no response.  Initially she received bag valve mask ventilation, in the emergency department she was intubated due to her GCS score being 3, in the emergency department patient blood sugar again was noted to be 12, she received D50 x2 and was started on D10. PCCM was consulted for help with evaluation and management  Pertinent  Medical History  Unintentional weight loss   Significant Hospital Events: Including procedures, antibiotic start and stop dates in addition to other pertinent events   7/6 admitted and intubated 7/10 remains intubate, MRI, EEG neg, slowly improving neuro exam, off sedation   Interim History / Subjective:   Remains critically ill. Intubated on life support. Withdraws to pain, some spontaneous movements   Objective   Blood pressure 118/72, pulse 97, temperature 99.9 F (37.7 C), temperature source Axillary, resp. rate 20, height 5\' 8"  (1.727 m), weight 55 kg, SpO2 97 %.    Vent Mode: PRVC FiO2 (%):  [30 %-40 %] 30 % Set Rate:  [18 bmp] 18 bmp Vt Set:  [510 mL] 510 mL PEEP:  [5 cmH20] 5 cmH20 Plateau Pressure:  [12 cmH20-21 cmH20] 14 cmH20   Intake/Output Summary (Last 24 hours) at 06/19/2021 1016 Last data filed at 06/19/2021 0800 Gross per  24 hour  Intake 1230.02 ml  Output 800 ml  Net 430.02 ml   Filed Weights   06/17/21 0407 06/18/21 0500 06/19/21 0500  Weight: 53.8 kg 55.5 kg 55 kg    Examination: General appearance: 40 y.o., female intubated on life support, critically ill  HENT: NCAT, ett in place Neck: trachea midline, CVL in place  Lungs: BL vented breaths  CV: RRR, s1 s2 Abdomen: soft, nt nd  Extremities: no peripheral edema  Skin: no rash  Neuro: off sedation, moving spontaneously, no opening eyes, but will withdraw to pain   Labs reviewed:  Sodium 141 K 3.7 Scr 0.37   Resolved Hospital Problem list     Assessment & Plan:   Coma, ?prolonged down time, possible anoxic brain injury, presumed drug overdose- exam concerning for profound neurological injury.  Found hypoglycemic, initial CT head neg.  MRI unrevealing. 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 Her neuro exam is slowly improving. She is now responding to pain and withdraws.  Best Practice (right click and "Reselect all SmartList Selections" daily)   Diet/type: TF DVT prophylaxis: heparin GI prophylaxis: PPI Lines: foley, central line Code Status:  full code Last date of multidisciplinary goals of care discussion- 7/7, get more info  This patient is critically ill with multiple organ system failure; which, requires frequent high complexity decision making, assessment,  support, evaluation, and titration of therapies. This was completed through the application of advanced monitoring technologies and extensive interpretation of multiple databases. During this encounter critical care time was devoted to patient care services described in this note for 32 minutes.  Josephine Igo, DO [facility] 06/19/2021 10:16 AM

## 2021-06-19 NOTE — Progress Notes (Signed)
Pt transported to MRI and back to 3M05 on ventilator without any complications. Vitals are stable, RN at bedside, RT will continue to monitor.

## 2021-06-19 NOTE — Progress Notes (Signed)
ID: Sonia Howard is a 40 y.o. female with past medical history of heroin use disorder, opioid use disorder, depression and chronic migraine who was brought in on 06/15/2021 after she was found unresponsive at her friend's house.  Per EMS, initial blood sugar was 23, which was treated however remained low on arrival to ED.  EEG showed generalized periodic discharges with triphasic morphology at 1 Hz without evolution on continuous overnight EEG monitoring, therefore favored to be toxic/metabolic.  MRI brain 7/7 was negative for acute abnormalities.   Subjective: Per nursing, copious bowel output, falling urine output Intermittent odd tonic posturing, happening at least 2-3 times, perhaps with care but not reliably so.  Secretions have become tan/thick, notable change from prior per respiratory at bedside Tmax 99.9 this morning    Exam: Vitals:   06/19/21 0730 06/19/21 0800  BP: 118/76 118/72  Pulse: 97 97  Resp: (!) 21 20  Temp: 99.9 F (37.7 C)   SpO2: 97% 97%   Gen: In bed, NAD Resp: non-labored breathing, no acute distress, ETT in place Abd: soft, nt  Neuro: MS: does not open eyes or follow commands CN: Pupils 7 mm to 4 mm, hippus, intermittently one or the other is sluggish, no blink to threat, corneals intact, does not orient eyes to voice, grimmace symmetric w/in limits of eval due to ETT. Intact cough and gag Motor: tone low throughout. No clear reliable movement to noxious stim today, does have some intermittent slight spontatenous movement difficult to characterize. At one point without clear trigger, tonic posturing of bilateral upper extremities with upgaze and wrists dosiflexed Sensory:as above  Pertinent Labs:  Basic Metabolic Panel: Recent Labs  Lab 06/15/21 1322 06/15/21 1621 06/16/21 0034 06/16/21 0331 06/16/21 0804 06/16/21 1512 06/17/21 0414 06/17/21 0416 06/17/21 2024 06/18/21 0402 06/19/21 0215  NA 145   < > 140 139  --   --   --  138  --  139 141  K  2.6*   < > 4.4 4.2  --   --   --  3.3*  --  3.2* 3.7  CL 110  --  112*  --   --   --   --  105  --  102 104  CO2 28  --  24  --   --   --   --  28  --  31 32  GLUCOSE 41*  --  111*  --   --   --   --  172*  --  117* 136*  BUN 21*  --  17  --   --   --   --  14  --  6 6  CREATININE 0.80  --  0.75  --   --   --   --  0.52  --  0.33* 0.37*  CALCIUM 7.7*  --  7.6*  --   --   --   --  7.5*  --  7.7* 7.7*  MG  --   --  1.7  --  2.0 2.4  --   --  1.7  --   --   PHOS  --    < > 2.6  --  2.7 2.6 1.7*  --  1.4* 3.0  --    < > = values in this interval not displayed.   Albumin 1.6  CBC: Recent Labs  Lab 06/15/21 1322 06/15/21 1621 06/16/21 0034 06/16/21 0331 06/17/21 0414 06/18/21 0402  WBC 2.5*  --  1.9*  --  5.9 6.7  NEUTROABS 1.9  --   --   --   --   --   HGB 12.9 13.9 12.6 11.2* 10.0* 9.6*  HCT 42.5 41.0 40.7 33.0* 30.0* 30.3*  MCV 90.0  --  87.9  --  86.7 87.6  PLT 218  --  190  --  146* 123*    Coagulation Studies: No results for input(s): LABPROT, INR in the last 72 hours.    Impression: 40 year old female with prolonged downtime with severe hypoglycemia.  Prognosis in the setting of hypoglycemic brain injury can symptoms be very difficult, but the fact that she has had signs of cortical function (withdraw from pain) is good.  She had generalized periodic discharges on EEG, felt not to be ictal in nature given their monomorphic nature.  However she is now having tonic posturing episodes and is not clearly withdrawing on my examination today though she does have some complex movements still spontaneously suggestive of some intact cortical activity.  Will obtain EEG for spell characterization and to confirm that intermittent seizures are not playing a role in decreased responsiveness to pain today.  Also worsening encephalopathy could be in the setting of infection given her borderline fever and worsening secretions.  Recommendations: - resume continuous EEG for spell capture -  appreciate eval for and treatment of infections including possible VAP per primary team, recommend normothermia - Recommendations conveyed to primary team via secure chat - neurology will continue to follow  Brooke Dare MD-PhD [facility] [phone]   If 7pm- 7am, please page neurology on call as listed in AMION.  CRITICAL CARE Performed by: Gordy Councilman   Total critical care time: 40 minutes  Critical care time was exclusive of separately billable procedures and treating other patients.  Critical care was necessary to treat or prevent imminent or life-threatening deterioration.  Critical care was time spent personally by me on the following activities: development of treatment plan with patient and/or surrogate as well as nursing, discussions with consultants, evaluation of patient's response to treatment, examination of patient, obtaining history from patient or surrogate, ordering and performing treatments and interventions, ordering and review of laboratory studies, ordering and review of radiographic studies, pulse oximetry and re-evaluation of patient's condition.

## 2021-06-19 NOTE — Progress Notes (Signed)
Called CT to check for ETA. Was told "it will be awhile." Confirmed with on-call neurologist, that EEG hook-up right now is ok. Will get CT & CTA when able.

## 2021-06-20 ENCOUNTER — Inpatient Hospital Stay (HOSPITAL_COMMUNITY): Payer: Medicaid Other

## 2021-06-20 DIAGNOSIS — I5181 Takotsubo syndrome: Secondary | ICD-10-CM

## 2021-06-20 LAB — COMPREHENSIVE METABOLIC PANEL
ALT: 29 U/L (ref 0–44)
AST: 27 U/L (ref 15–41)
Albumin: 1.5 g/dL — ABNORMAL LOW (ref 3.5–5.0)
Alkaline Phosphatase: 63 U/L (ref 38–126)
Anion gap: 4 — ABNORMAL LOW (ref 5–15)
BUN: 6 mg/dL (ref 6–20)
CO2: 34 mmol/L — ABNORMAL HIGH (ref 22–32)
Calcium: 7.8 mg/dL — ABNORMAL LOW (ref 8.9–10.3)
Chloride: 102 mmol/L (ref 98–111)
Creatinine, Ser: 0.33 mg/dL — ABNORMAL LOW (ref 0.44–1.00)
GFR, Estimated: >60 mL/min (ref >60)
Glucose, Bld: 131 mg/dL — ABNORMAL HIGH (ref 70–99)
Potassium: 3.1 mmol/L — ABNORMAL LOW (ref 3.5–5.1)
Sodium: 140 mmol/L (ref 135–145)
Total Bilirubin: 0.6 mg/dL (ref 0.3–1.2)
Total Protein: 4.9 g/dL — ABNORMAL LOW (ref 6.5–8.1)

## 2021-06-20 LAB — CBC
HCT: 28.6 % — ABNORMAL LOW (ref 36.0–46.0)
Hemoglobin: 9 g/dL — ABNORMAL LOW (ref 12.0–15.0)
MCH: 27.9 pg (ref 26.0–34.0)
MCHC: 31.5 g/dL (ref 30.0–36.0)
MCV: 88.5 fL (ref 80.0–100.0)
Platelets: 136 10*3/uL — ABNORMAL LOW (ref 150–400)
RBC: 3.23 MIL/uL — ABNORMAL LOW (ref 3.87–5.11)
RDW: 21.1 % — ABNORMAL HIGH (ref 11.5–15.5)
WBC: 5.6 10*3/uL (ref 4.0–10.5)
nRBC: 0 % (ref 0.0–0.2)

## 2021-06-20 LAB — VITAMIN B1: Vitamin B1 (Thiamine): 103.7 nmol/L (ref 66.5–200.0)

## 2021-06-20 LAB — GLUCOSE, CAPILLARY
Glucose-Capillary: 131 mg/dL — ABNORMAL HIGH (ref 70–99)
Glucose-Capillary: 130 mg/dL — ABNORMAL HIGH (ref 70–99)
Glucose-Capillary: 114 mg/dL — ABNORMAL HIGH (ref 70–99)
Glucose-Capillary: 111 mg/dL — ABNORMAL HIGH (ref 70–99)
Glucose-Capillary: 141 mg/dL — ABNORMAL HIGH (ref 70–99)
Glucose-Capillary: 111 mg/dL — ABNORMAL HIGH (ref 70–99)

## 2021-06-20 IMAGING — DX DG ABD PORTABLE 1V
1 series · 1 of 1 positions shown · non-contrast
Comparison: None.

CLINICAL DATA: G-tube placement.

EXAM:
PORTABLE ABDOMEN - 1 VIEW

[abdomen supine]
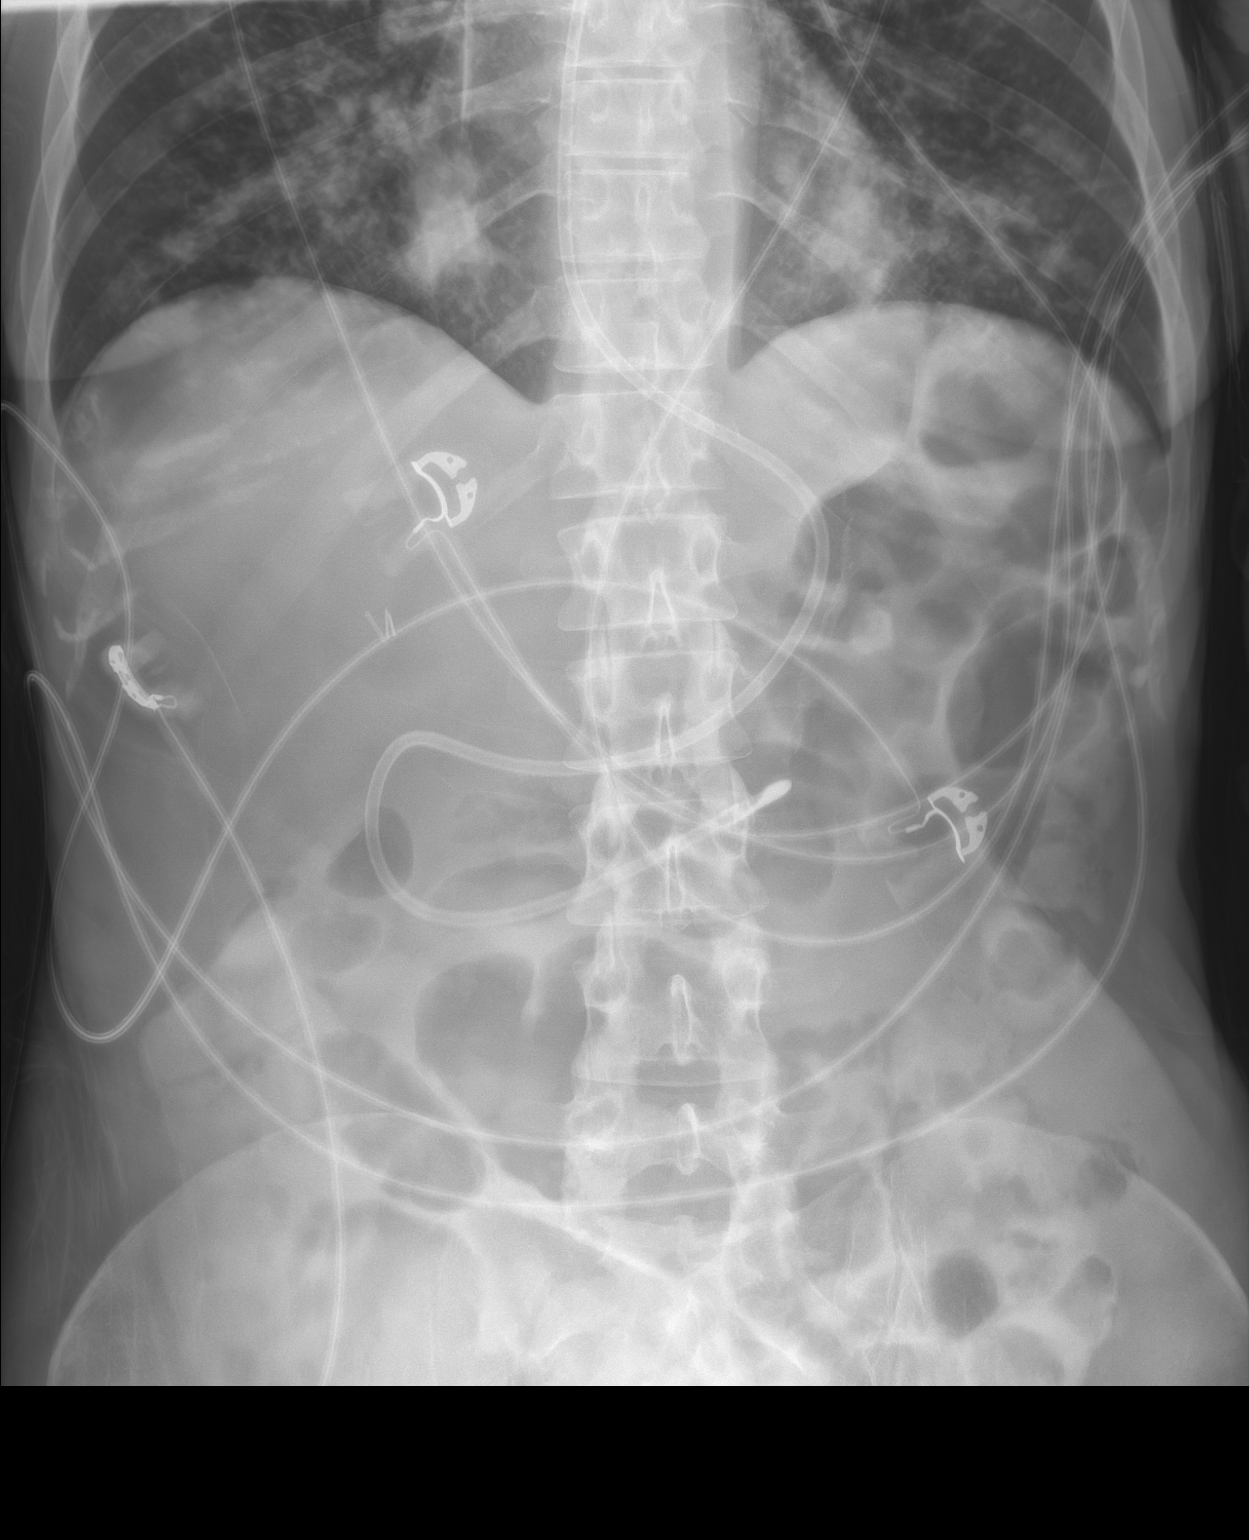

[1 of 1 positions shown; findings below may reference images not displayed]

FINDINGS: Feeding tube terminates in the horizontal portion of the duodenum,
approaching the ligament of Treitz. Bowel gas pattern is
unremarkable. Patchy airspace consolidation in the lung bases.
IMPRESSION: 1. Feeding tube terminates in the distal duodenum.
2. Bibasilar airspace opacities, last evaluated by chest radiograph
on [DATE].

## 2021-06-20 IMAGING — CT CT ANGIO HEAD
3 of 11 series · 9 of 47 positions shown · IV contrast (omnipaque)
Comparison: Prior MRI from [DATE] and [DATE].

CLINICAL DATA: Initial evaluation for possible subarachnoid
hemorrhage on recent MRI.

EXAM:
CT ANGIOGRAPHY HEAD
TECHNIQUE: Multidetector CT imaging of the head was performed using the
standard protocol during bolus administration of intravenous
contrast. Multiplanar CT image reconstructions and MIPs were
obtained to evaluate the vascular anatomy.
CONTRAST:  60mL OMNIPAQUE IOHEXOL 350 MG/ML SOLN

[Series 5: cow 2.0 · axial · 0.42mm/px · z∈[-158,-52]mm · 4 of 89 slices shown]
[im 18/89  brain]
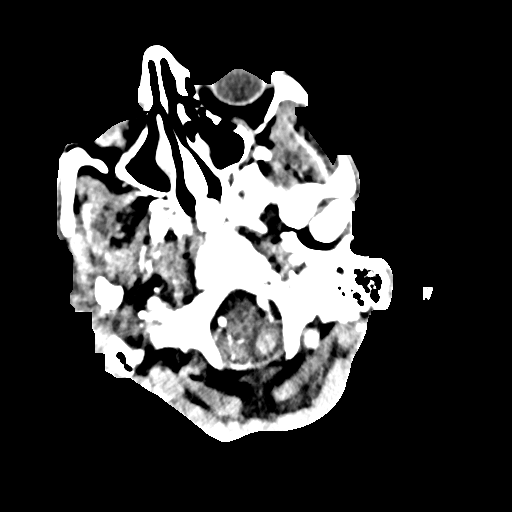
[im 36/89  bone]
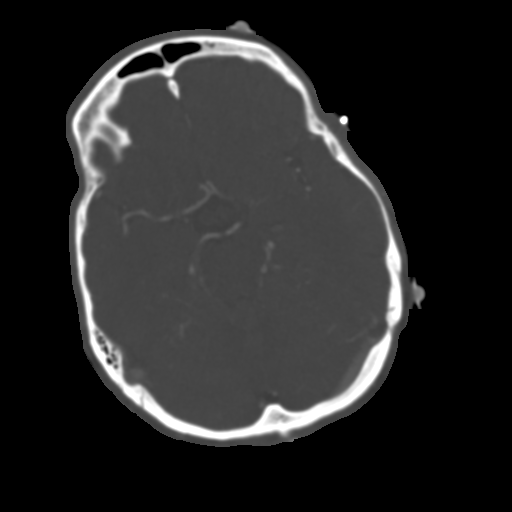
[im 53/89  brain]
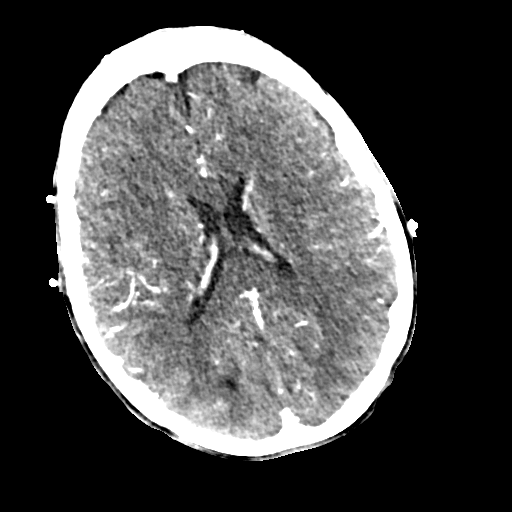
[im 71/89  bone]
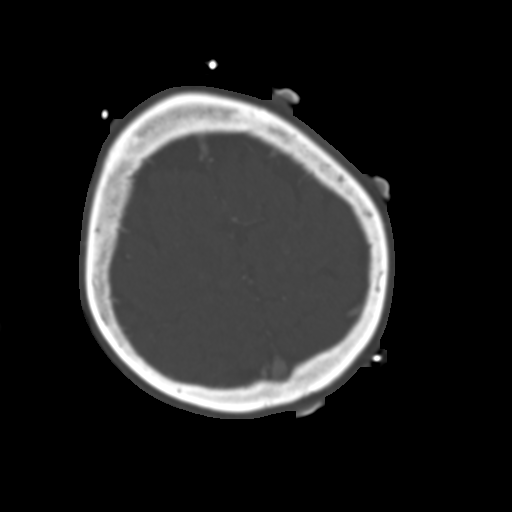

[Series 7: head bone · axial · 0.40mm/px · z∈[-115,-19]mm · 4 of 82 slices shown]
[im 17/82  bone]
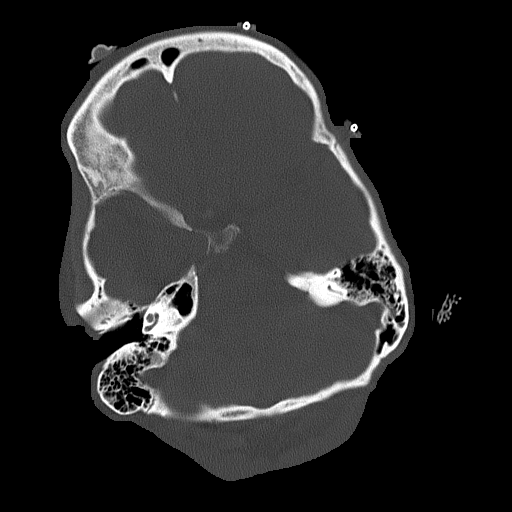
[im 33/82  bone]
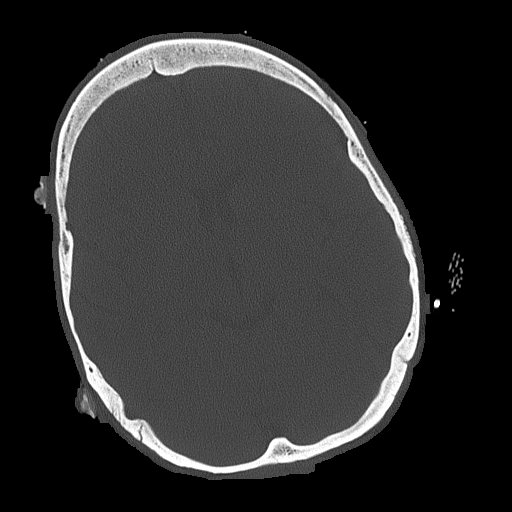
[im 49/82  bone]
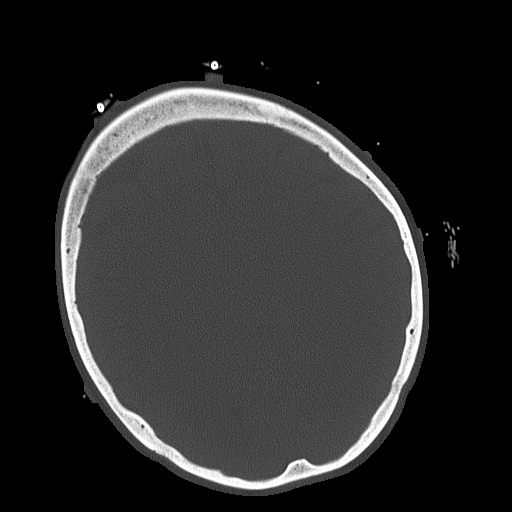
[im 65/82  bone]
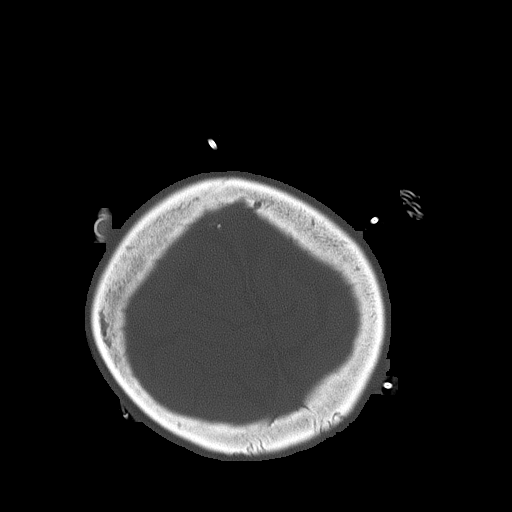

[Series 9: sagittal · sagittal · 0.32mm/px · 1 of 66 slices shown]
[im 51/66  brain]
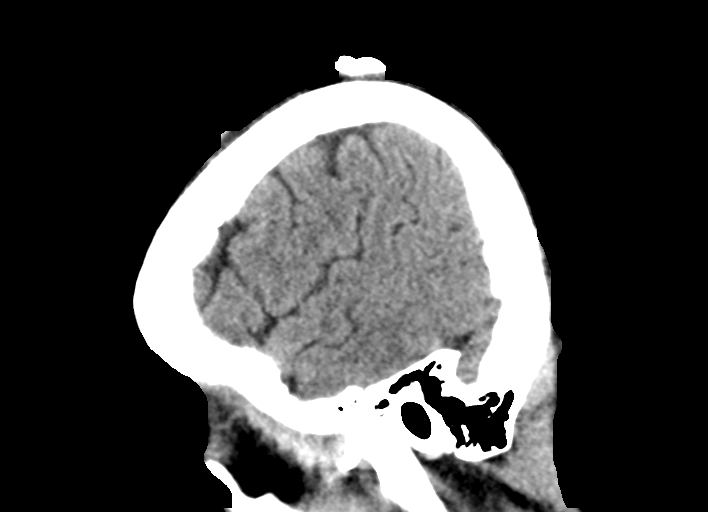

[9 of 47 positions shown; findings below may reference images not displayed]

FINDINGS: CT HEAD

Brain: Mild generalized cerebral atrophy for age. No acute
subarachnoid or other intracranial hemorrhage seen to correspond
with previously question susceptible artifact on prior MRI. This was
likely artifactual. No acute large vessel territory infarct. No mass
lesion, midline shift or mass effect. No hydrocephalus or
extra-axial fluid collection.

Vascular: No hyperdense vessel.

Skull: EEG leads present at the scalp.  Calvarium intact.

Sinuses: Nasogastric tube partially visualized. Mild scattered
mucosal thickening noted within the visualized sphenoid ethmoidal
sinuses. No mastoid effusion.

Other: Visualized globes and orbital soft tissues within normal
limits.

CTA HEAD

Anterior circulation: Both internal carotid arteries widely patent
to the termini without stenosis. A1 segments widely patent. Normal
anterior communicating artery complex. Both anterior cerebral
arteries widely patent to their distal aspects without stenosis. No
M1 stenosis or occlusion. Normal MCA bifurcations. Distal MCA
branches well perfused and symmetric.

Posterior circulation: Both V4 segments patent to the
vertebrobasilar junction without stenosis. Both PICA origins patent
and normal. Basilar widely patent to its distal aspect without
stenosis. Superior cerebellar arteries patent bilaterally. Both PCAs
primarily supplied via the basilar and are well perfused to there
distal aspects.

Venous sinuses: Grossly patent allowing for timing the contrast
bolus. A CT venogram was not performed. No obvious dural sinus
thrombosis or cortical vein thrombosis.

Anatomic variants: None significant.  No aneurysm.

Review of the MIP images confirms the above findings.
IMPRESSION: CT HEAD IMPRESSION:

No acute intracranial abnormality. Specifically, no convexity
subarachnoid hemorrhage seen to correspond with questioned
susceptibility artifact on prior MRI. This was likely artifactual.

CTA HEAD IMPRESSION:

1. Normal CTA of the head. No large vessel occlusion,
hemodynamically significant stenosis, or other acute vascular
abnormality.
2. Please note that a CT venogram was not performed.

## 2021-06-20 MED ORDER — ENOXAPARIN SODIUM 40 MG/0.4ML IJ SOSY
40.0000 mg | PREFILLED_SYRINGE | INTRAMUSCULAR | Status: DC
  Administered 2021-06-20 – 2021-06-25 (×6): 40 mg via SUBCUTANEOUS
  Filled 2021-06-20 (×6): qty 0.4

## 2021-06-20 MED ORDER — POTASSIUM CHLORIDE 10 MEQ/50ML IV SOLN
10.0000 meq | INTRAVENOUS | Status: AC
Start: 2021-06-20 — End: 2021-06-20
  Administered 2021-06-20 (×4): 10 meq via INTRAVENOUS
  Filled 2021-06-20 (×4): qty 50

## 2021-06-20 MED ORDER — IOHEXOL 350 MG/ML SOLN
75.0000 mL | Freq: Once | INTRAVENOUS | Status: AC | PRN
  Administered 2021-06-20: 60 mL via INTRAVENOUS

## 2021-06-20 MED ORDER — POTASSIUM CHLORIDE 20 MEQ PO PACK
20.0000 meq | PACK | ORAL | Status: AC
  Administered 2021-06-20 (×2): 20 meq
  Filled 2021-06-20 (×2): qty 1

## 2021-06-20 MED ORDER — ZINC SULFATE 220 (50 ZN) MG PO CAPS
220.0000 mg | ORAL_CAPSULE | Freq: Two times a day (BID) | ORAL | Status: AC
  Administered 2021-06-20 – 2021-06-29 (×20): 220 mg
  Filled 2021-06-20 (×20): qty 1

## 2021-06-20 NOTE — Progress Notes (Signed)
Cardiology Progress Note  Patient ID: Sonia Howard MRN: [id number] DOB: [birth year] Date of Encounter: 06/20/2021  Primary Cardiologist: None  Subjective   Chief Complaint: None.  HPI: Intubated.  Minimal neurological response per nursing.  Hemodynamically stable.  Echocardiogram consistent with stress-induced cardiomyopathy.  ROS:  All other ROS reviewed and negative. Pertinent positives noted in the HPI.     Inpatient Medications  Scheduled Meds:  aspirin  81 mg Per Tube Daily   atorvastatin  20 mg Per Tube Daily   calcium carbonate (dosed in mg elemental calcium)  250 mg of elemental calcium Per Tube TID   chlorhexidine gluconate (MEDLINE KIT)  15 mL Mouth Rinse BID   Chlorhexidine Gluconate Cloth  6 each Topical Daily   docusate  100 mg Per Tube BID   feeding supplement (PROSource TF)  45 mL Per Tube Daily   heparin injection (subcutaneous)  5,000 Units Subcutaneous Q8H   mouth rinse  15 mL Mouth Rinse 10 times per day   multivitamin with minerals  1 tablet Per Tube BID   pantoprazole sodium  40 mg Per Tube Daily   polyethylene glycol  17 g Per Tube Daily   potassium chloride  20 mEq Per Tube Q4H   sodium chloride flush  10-40 mL Intracatheter Q12H   thiamine  100 mg Per Tube Daily   Continuous Infusions:  sodium chloride Stopped (06/17/21 1741)   sodium chloride     sodium chloride 10 mL/hr at 06/20/21 0600   cefTRIAXone (ROCEPHIN)  IV Stopped (06/20/21 0135)   feeding supplement (VITAL 1.5 CAL) 50 mL/hr at 06/19/21 1800   potassium chloride 10 mEq (06/20/21 0829)   PRN Meds: docusate, fentaNYL (SUBLIMAZE) injection, ondansetron (ZOFRAN) IV, polyethylene glycol, sodium chloride flush   Vital Signs   Vitals:   06/20/21 0500 06/20/21 0600 06/20/21 0804 06/20/21 0824  BP: 116/76 120/73    Pulse: 95 93    Resp: 18 20    Temp:   98.9 F (37.2 C)   TempSrc:   Axillary   SpO2: 97% 97%  96%  Weight:      Height:        Intake/Output Summary (Last 24 hours) at  06/20/2021 0841 Last data filed at 06/20/2021 0600 Gross per 24 hour  Intake 1562.19 ml  Output 1425 ml  Net 137.19 ml   Last 3 Weights 06/20/2021 06/19/2021 06/18/2021  Weight (lbs) 121 lb 11.1 oz 121 lb 4.1 oz 122 lb 5.7 oz  Weight (kg) 55.2 kg 55 kg 55.5 kg      Telemetry  Overnight telemetry shows sinus rhythm in the 90s, which I personally reviewed.   Physical Exam   Vitals:   06/20/21 0500 06/20/21 0600 06/20/21 0804 06/20/21 0824  BP: 116/76 120/73    Pulse: 95 93    Resp: 18 20    Temp:   98.9 F (37.2 C)   TempSrc:   Axillary   SpO2: 97% 97%  96%  Weight:      Height:        Intake/Output Summary (Last 24 hours) at 06/20/2021 0841 Last data filed at 06/20/2021 0600 Gross per 24 hour  Intake 1562.19 ml  Output 1425 ml  Net 137.19 ml    Last 3 Weights 06/20/2021 06/19/2021 06/18/2021  Weight (lbs) 121 lb 11.1 oz 121 lb 4.1 oz 122 lb 5.7 oz  Weight (kg) 55.2 kg 55 kg 55.5 kg    Body mass index is 18.5 kg/m.  General: Intubated on  the vent Head: Atraumatic, normal size  Eyes: No scleral icterus Neck: Supple, no JVD Endocrine: No thryomegaly Cardiac: Normal S1, S2; RRR; no murmurs, rubs, or gallops Lungs: Diminished breath sounds bilaterally Abd: Soft, nontender, no hepatomegaly  Ext: No edema, pulses 2+ Musculoskeletal: No deformities Neuro: Intubated on the vent  Labs  High Sensitivity Troponin:   Recent Labs  Lab 06/15/21 1322 06/16/21 0034 06/16/21 0804  TROPONINIHS 2,116* 2,252* 1,413*     Cardiac EnzymesNo results for input(s): TROPONINI in the last 168 hours. No results for input(s): TROPIPOC in the last 168 hours.  Chemistry Recent Labs  Lab 06/18/21 0402 06/19/21 0215 06/20/21 0344  NA 139 141 140  K 3.2* 3.7 3.1*  CL 102 104 102  CO2 31 32 34*  GLUCOSE 117* 136* 131*  BUN $Re'6 6 6  'tzW$ CREATININE 0.33* 0.37* 0.33*  CALCIUM 7.7* 7.7* 7.8*  PROT 4.9* 5.1* 4.9*  ALBUMIN 1.6* 1.6* 1.5*  AST 28 36 27  ALT 33 37 29  ALKPHOS 70 73 63  BILITOT 0.4  0.4 0.6  GFRNONAA >60 >60 >60  ANIONGAP 6 5 4*    Hematology Recent Labs  Lab 06/18/21 0402 06/19/21 1338 06/20/21 0344  WBC 6.7 7.4 5.6  RBC 3.46* 3.38* 3.23*  HGB 9.6* 9.5* 9.0*  HCT 30.3* 29.7* 28.6*  MCV 87.6 87.9 88.5  MCH 27.7 28.1 27.9  MCHC 31.7 32.0 31.5  RDW 22.2* 21.3* 21.1*  PLT 123* 143* 136*   BNP Recent Labs  Lab 06/16/21 0034  BNP 1,055.3*    DDimer No results for input(s): DDIMER in the last 168 hours.   Radiology  CT ANGIO HEAD W OR WO CONTRAST  Result Date: 06/20/2021 CLINICAL DATA:  Initial evaluation for possible subarachnoid hemorrhage on recent MRI. EXAM: CT ANGIOGRAPHY HEAD TECHNIQUE: Multidetector CT imaging of the head was performed using the standard protocol during bolus administration of intravenous contrast. Multiplanar CT image reconstructions and MIPs were obtained to evaluate the vascular anatomy. CONTRAST:  65mL OMNIPAQUE IOHEXOL 350 MG/ML SOLN COMPARISON:  Prior MRI from 06/19/2021 and 06/16/2021. FINDINGS: CT HEAD Brain: Mild generalized cerebral atrophy for age. No acute subarachnoid or other intracranial hemorrhage seen to correspond with previously question susceptible artifact on prior MRI. This was likely artifactual. No acute large vessel territory infarct. No mass lesion, midline shift or mass effect. No hydrocephalus or extra-axial fluid collection. Vascular: No hyperdense vessel. Skull: EEG leads present at the scalp.  Calvarium intact. Sinuses: Nasogastric tube partially visualized. Mild scattered mucosal thickening noted within the visualized sphenoid ethmoidal sinuses. No mastoid effusion. Other: Visualized globes and orbital soft tissues within normal limits. CTA HEAD Anterior circulation: Both internal carotid arteries widely patent to the termini without stenosis. A1 segments widely patent. Normal anterior communicating artery complex. Both anterior cerebral arteries widely patent to their distal aspects without stenosis. No M1  stenosis or occlusion. Normal MCA bifurcations. Distal MCA branches well perfused and symmetric. Posterior circulation: Both V4 segments patent to the vertebrobasilar junction without stenosis. Both PICA origins patent and normal. Basilar widely patent to its distal aspect without stenosis. Superior cerebellar arteries patent bilaterally. Both PCAs primarily supplied via the basilar and are well perfused to there distal aspects. Venous sinuses: Grossly patent allowing for timing the contrast bolus. A CT venogram was not performed. No obvious dural sinus thrombosis or cortical vein thrombosis. Anatomic variants: None significant.  No aneurysm. Review of the MIP images confirms the above findings. IMPRESSION: CT HEAD IMPRESSION: No acute intracranial abnormality.  Specifically, no convexity subarachnoid hemorrhage seen to correspond with questioned susceptibility artifact on prior MRI. This was likely artifactual. CTA HEAD IMPRESSION: 1. Normal CTA of the head. No large vessel occlusion, hemodynamically significant stenosis, or other acute vascular abnormality. 2. Please note that a CT venogram was not performed. Electronically Signed   By: Jeannine Boga M.D.   On: 06/20/2021 03:33   DG Abd 1 View  Result Date: 06/18/2021 CLINICAL DATA:  OG tube placement EXAM: ABDOMEN - 1 VIEW COMPARISON:  06/17/2021 FINDINGS: Feeding tube is in place in the descending duodenum. NG tube tip is in the mid stomach. Nonobstructive bowel gas pattern. No organomegaly or free air. IMPRESSION: Feeding tube in the descending duodenum and NG tube in the stomach. Electronically Signed   By: Rolm Baptise M.D.   On: 06/18/2021 12:25   MR BRAIN W WO CONTRAST  Addendum Date: 06/19/2021   ADDENDUM REPORT: 06/19/2021 17:59 ADDENDUM: In addition to possible small volume subarachnoid hemorrhage, thrombosis of a small draining cortical vein is a differential consideration for the susceptibility artifact described in the report. There is no  clear evidence of dural sinus thrombosis on the postcontrast imaging on this exam; however the study is limited by motion in this region. In addition to a non-contrast head CT (to evaluate for acute hemorrhage), a CTV may be useful to better evaluate this area for a thrombosed draining vein. Findings in the initial report and this addendum discussed with Dr. Maurine Minister at 5:50 p.m. Electronically Signed   By: Margaretha Sheffield MD   On: 06/19/2021 17:59   Result Date: 06/19/2021 CLINICAL DATA:  Mental status change. EXAM: MRI HEAD WITHOUT AND WITH CONTRAST TECHNIQUE: Multiplanar, multiecho pulse sequences of the brain and surrounding structures were obtained without and with intravenous contrast. CONTRAST:  5.56mL GADAVIST GADOBUTROL 1 MMOL/ML IV SOLN COMPARISON:  06/16/2021. FINDINGS: Motion limited evaluation.  Within this limitation: Brain: No acute infarction, hydrocephalus, extra-axial collection or mass lesion. Likely sulcal high left frontoparietal susceptibility artifact (series 12, images 43 through 46) which was not apparent on the recent July 7th motion limited MRI. No clear signal abnormality in this region on the other motion limited sequences. No abnormal enhancement. Vascular: Major arterial flow voids are maintained at the skull base. Skull and upper cervical spine: Normal marrow signal. Sinuses/Orbits: Mild right maxillary sinus mucosal thickening. Other: Trace bilateral mastoid effusions. IMPRESSION: 1. Likely sulcal high left frontoparietal susceptibility artifact. This could potentially represent the sequela of prior subarachnoid hemorrhage; however, the finding was not apparent on the recent motion limited July 7th MRI. Therefore, recommend noncontrast head CT to help exclude acute subarachnoid hemorrhage. 2. Otherwise, no evidence of acute intracranial abnormality on this motion limited exam. 3. Mild right maxillary sinus disease with trace bilateral mastoid effusions. These results will be called  to the ordering clinician or representative by the Radiologist Assistant, and communication documented in the PACS or [employer]. Electronically Signed: By: Margaretha Sheffield MD On: 06/19/2021 17:25   Overnight EEG with video  Result Date: 06/18/2021 Lora Havens, MD     06/19/2021  9:19 AM Patient Name: Sonia Howard MRN: [id number] Epilepsy Attending: Lora Havens Referring Physician/Provider: Dr. Ina Homes Duration: 06/17/2021 1010 to 06/18/2021 1141  Patient history: 40 year old female with history of drug overdose and unintentional weight loss brought in after she was found unresponsive at a friend's home.  Blood sugar was 23.  EEG to evaluate for seizures.  Level of alertness: Comatose  AEDs during EEG  study: None  Technical aspects: This EEG study was done with scalp electrodes positioned according to the 10-20 International system of electrode placement. Electrical activity was acquired at a sampling rate of $Remov'500Hz'ljqxEh$  and reviewed with a high frequency filter of $RemoveB'70Hz'OtQVlZjH$  and a low frequency filter of $RemoveB'1Hz'JHWJwJop$ . EEG data were recorded continuously and digitally stored.  Description: EEG showed continuous generalized 3 to 6 Hz theta-delta slowing. Generalized periodic discharges with triphasic morphology at 1-1.5 Hz were also noted.  EEG was reactive to noxious stimulation.  Hyperventilation and photic stimulation were not performed.    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 No seizures or definite epileptiform discharges were seen throughout the recording.  Lora Havens    Cardiac Studies  TTE 06/16/2021  1. Left ventricular ejection fraction, by estimation, is 30 to 35%. The  left ventricle has moderately decreased function. The left ventricle  demonstrates  regional wall motion abnormalities. The basal segments of the  LV are severely hypokinetic. The  mid-apical segments function normally. ?Reverse Takotsubo picture. The  left ventricular internal cavity size was mildly dilated. Left ventricular  diastolic parameters are indeterminate.   2. Right ventricular systolic function is mildly reduced. The right  ventricular size is normal. Tricuspid regurgitation signal is inadequate  for assessing PA pressure.   3. Left atrial size was moderately dilated.   4. The mitral valve is normal in structure. Trivial mitral valve  regurgitation. No evidence of mitral stenosis.   5. The aortic valve is tricuspid. Aortic valve regurgitation is not  visualized. No aortic stenosis is present.   6. The inferior vena cava is normal in size with greater than 50%  respiratory variability, suggesting right atrial pressure of 3 mmHg.   7. A small pericardial effusion is present.   Patient Profile  Hermina Barnard is a 40 y.o. female with migraine, anxiety, depression who was admitted on 06/15/2021 after being found unresponsive.  Concerns for possible overdose.  Cardiology consulted for elevated troponin.  Also found to have reduced EF with wall motion consistent with stress-induced cardiomyopathy.  Assessment & Plan   Elevated troponin/acute systolic heart failure/stress-induced cardiomyopathy -Echocardiogram reviewed.  Wall motion is in the basal segments.  The apical and mid segments are hypokinetic.  This does not fit a coronary distribution.  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 -Troponin elevation is minimal and flat for LV dysfunction.  This is not acute coronary syndrome. -Course has been complicated by encephalopathy with concern for possible drug overdose. -For now would not recommend to treat this as an acute coronary syndrome.  Cardiology will sign off at this time.  If her neurologic state recovers please notify us and we can  repeat an echocardiogram.  I would not pursue an ischemia evaluation at this time.  She is not a candidate for cardiac cath.  I suspect this is all stress-induced. -We will continue with supportive care for now.  Would not aggressively treat her cardiomyopathy and less we know that her neurologic status will improve.  [facility] will sign off.   Medication Recommendations: As above Other recommendations (labs, testing, etc): Please notify us if her neurologic status improves.  We will make further recommendations.  For now we will continue with supportive care. Follow up as an outpatient:  none needed  For questions or  updates, please contact [place] Please consult [email or web address] for contact info under   Time Spent with Patient: I have spent a total of 25 minutes with patient reviewing hospital notes, telemetry, EKGs, labs and examining the patient as well as establishing an assessment and plan that was discussed with the patient.  > 50% of time was spent in direct patient care.    Signed, Addison Naegeli. Audie Box, MD, [place]  06/20/2021 8:41 AM

## 2021-06-20 NOTE — Progress Notes (Signed)
LTM EEG discontinued - no skin breakdown at unhook. [facility] notified 

## 2021-06-20 NOTE — Progress Notes (Signed)
LTM maintenance completed; no skin breakdown was seen. 

## 2021-06-20 NOTE — Progress Notes (Signed)
NAME:  Sonia Howard, MRN:  [id number], DOB:  [birth year], LOS: 5 ADMISSION DATE:  06/15/2021, CONSULTATION DATE: 06/15/2021 REFERRING MD:  Maia Plan, MD , CHIEF COMPLAINT: Found unresponsive  History of Present Illness:  Patient is intubated and unresponsive, most of the history is taken from chart review  40 year old female with history of drug overdose and unintentional weight loss who was brought into the emergency department after she was found unresponsive at her friend's house, her last known well was 5 PM yesterday.  Patient's vital signs were stable per EMS report but she was not responsive, her blood sugar was 23, she was given 2 A of D50 with that blood sugar improved to 250, she was given 2 mg of Narcan with no response.  Initially she received bag valve mask ventilation, in the emergency department she was intubated due to her GCS score being 3, in the emergency department patient blood sugar again was noted to be 12, she received D50 x2 and was started on D10. PCCM was consulted for help with evaluation and management  Collateral history per patient's brother: patient has a history of opioid use disorder, specifically fentanyl. Her father passed away in 2023-02-18, and per brother she was found trying to remove some of the fentanyl from her father's port line. She went to [facility] for rehab for 6 weeks in the beginning of April and was put on suboxone. She is legally married to [employer] but have been separated around a year. Per brother, he does not want to have anything to do with her. Patient was living with her friend Mardelle Matte, and Andy's mom who also have a history of IVDU. Patient was found unresponsive by Andy's mom who called EMS.   Pertinent  Medical History  Unintentional weight loss, drug overdose, heroin and opioid use, chronic migraine, bariatric surgery   Significant Hospital Events: Including procedures, antibiotic start and stop dates in addition to  other pertinent events   7/6 admitted and intubated 7/7 ceftriaxone started 7/10 remains intubate, MRI, EEG neg, slowly improving neuro exam, off sedation   Interim History / Subjective:   Remains critically ill. Intubated on life support. Withdraws to pain, some spontaneous movements. Upgoing babinskis. Updated patient's brother, who said he and his wife anticipate talking with neurology regarding goals of care.  Objective   Blood pressure 120/73, pulse 93, temperature 98.9 F (37.2 C), temperature source Axillary, resp. rate 20, height 5\' 8"  (1.727 m), weight 55.2 kg, SpO2 96 %.    Vent Mode: PRVC FiO2 (%):  [30 %-40 %] 40 % Set Rate:  [18 bmp] 18 bmp Vt Set:  [510 mL] 510 mL PEEP:  [5 cmH20] 5 cmH20 Plateau Pressure:  [12 cmH20-17 cmH20] 14 cmH20   Intake/Output Summary (Last 24 hours) at 06/20/2021 0943 Last data filed at 06/20/2021 0600 Gross per 24 hour  Intake 1552.2 ml  Output 1425 ml  Net 127.2 ml    Filed Weights   06/18/21 0500 06/19/21 0500 06/20/21 0340  Weight: 55.5 kg 55 kg 55.2 kg    Examination: General appearance: 40 y.o., female intubated on life support, critically ill  HENT: NCAT, ett in place, pupils dilated to 7 mm, reactive bilaterally. Neck: trachea midline, CVL in place  Lungs: BL vented breaths  CV: RRR, s1 s2 Abdomen: soft, nt nd  Extremities: no peripheral edema  Skin: no rash  Neuro: off sedation, moving feet to stimulation on plantar aspect, right pupil sluggish, concentric pupillary reflex intact,  corneal reflex intact, cough reflex intact, opens eyes to sternal rub, does not withdraw to pain, upgoing babinski bilaterally, brisk patellar reflex bilaterally.   Resolved Hospital Problem list     Assessment & Plan:   Coma, ?prolonged down time, possible anoxic brain injury, presumed drug overdose- exam concerning for profound neurological injury.  Found hypoglycemic. Initial CT head neg. MRI unrevealing. EEG no seizure. Recent MRI, CTA head  ok. -neurology following -Repeat VEEG pending. -hold continuous sedation -avoid benzos -monitor electrolytes and replenish as needed  Hypoglycemia- Glucose 12 in ED and received D50x2 and D10 drip. Glucose stable now.  C-peptide low, proinsulin/insulin ratio pending. -Monitor blood glucose q4h  Shock, troponin leak, possible stress induced cardiomyopathy- formal echo more c/w stress cardiomyopathy; given uncertain neurological prognosis, no role for LHC at this time; cardiology signed off, s/p heparin drip. -d/c atorvastatin -ASA 81 mg qd  Aspiration pneumonia, bilateral lower lungs- bilateral opacities on 7/7 CXR.  -Ceftriaxone 2 g q24h (day 5/7)  Acute hypoxemic respiratory failure requiring intubation and mechanical ventilation- vent originally set to [facility]. Starting to wean with PS/CPAP.  Known Hx of IVDA -fentanyl 25-100 mcg prn  Leukopenia: WBC noted to be 2.5 on admission. Trending back up. -monitor WBC (2.5 >1.9>5.9>6.7>7.4>5.6)  DVT prophylaxis: -d/c heparin injection 5000 U q8h -start enoxaparin 40 mg qd  GI prophylaxis: -Protonix 40mg  qd  Diet: -Cortrak in place -feeding supplement Prosource TF 45 mL qd -VITAL 1.5 CAL continuous feeding supplement -multivitamin with minerals 1 tablet BID -calcium carbonate 250 mg TID -thiamine 100 mg qd (thiamine lab pending) -Vit C and Folate lab pending  Best Practice (right click and "Reselect all SmartList Selections" daily)   Diet/type: TF DVT prophylaxis: heparin GI prophylaxis: PPI Lines: foley, central line Code Status:  full code Last date of multidisciplinary goals of care discussion- 7/7, get more info  This patient is critically ill with multiple organ system failure; which, requires frequent high complexity decision making, assessment, support, evaluation, and titration of therapies. This was completed through the application of advanced monitoring technologies and extensive interpretation of multiple  databases. During this encounter critical care time was devoted to patient care services described in this note for 35 minutes.   [place] [facility] 06/20/2021 9:43 AM

## 2021-06-20 NOTE — Progress Notes (Signed)
K+ 3.1 °Replaced per protocol  °

## 2021-06-20 NOTE — Progress Notes (Signed)
ID: Sonia Howard is a 40 y.o. female with past medical history of heroin use disorder, opioid use disorder, depression and chronic migraine who was brought in on 06/15/2021 after she was found unresponsive at her friend's house.  Per EMS, initial blood sugar was 23, which was treated however remained low on arrival to ED.  EEG showed generalized periodic discharges with triphasic morphology at 1 Hz without evolution on continuous overnight EEG monitoring, therefore favored to be toxic/metabolic.  MRI brain 7/7 was negative for acute abnormalities. 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  CT negative for acute stroke  CTA negative for aneurysm, venogram not fully captured.  No further posturing Completed ceftriaxone 7/10  Exam: Vitals:   06/20/21 0804 06/20/21 0824  BP:    Pulse:    Resp:    Temp: 98.9 F (37.2 C)   SpO2:  96%   Gen: In bed, NAD Resp: non-labored breathing, no acute distress, ETT in place Abd: soft, nt  Neuro: MS: opens eyes to noxious stim of sternal rub CN: Pupils 7 mm to 4 mm, brisk, no blink to threat, corneals intact, blinks intermittently spontaneously, roving eye movements at times, does not track, does not orient eyes to voice, grimmace symmetric w/in limits of eval due to ETT. Intact cough and gag Motor/sensory: Tone and bulk low. Right upper extremity withdraws slightly to noxious stim, left upper extremity moves briefly antigravity to sternal rub (but not peripheral noxious stim) Withdraws right lower extremity to tickle. Triple flexion of the Left lower extremity.  Reflexes: Clonus bilateral lower extremities  Pertinent Labs:  Basic Metabolic Panel: Recent Labs  Lab 06/16/21 0034 06/16/21 0331 06/16/21 0804 06/16/21 1512 06/17/21 0414 06/17/21 0416 06/17/21 2024 06/18/21 0402 06/19/21 0215 06/20/21 0344   NA 140 139  --   --   --  138  --  139 141 140  K 4.4 4.2  --   --   --  3.3*  --  3.2* 3.7 3.1*  CL 112*  --   --   --   --  105  --  102 104 102  CO2 24  --   --   --   --  28  --  31 32 34*  GLUCOSE 111*  --   --   --   --  172*  --  117* 136* 131*  BUN 17  --   --   --   --  14  --  6 6 6   CREATININE 0.75  --   --   --   --  0.52  --  0.33* 0.37* 0.33*  CALCIUM 7.6*  --   --   --   --  7.5*  --  7.7* 7.7* 7.8*  MG 1.7  --  2.0 2.4  --   --  1.7  --   --   --   PHOS 2.6  --  2.7 2.6 1.7*  --  1.4* 3.0  --   --     Albumin 1.6  CBC: Recent Labs  Lab 06/15/21 1322 06/15/21 1621 06/16/21 0034 06/16/21 0331 06/17/21 0414 06/18/21 0402 06/19/21 1338 06/20/21 0344  WBC 2.5*  --  1.9*  --  5.9 6.7 7.4 5.6  NEUTROABS 1.9  --   --   --   --   --  6.1  --   HGB  12.9   < > 12.6 11.2* 10.0* 9.6* 9.5* 9.0*  HCT 42.5   < > 40.7 33.0* 30.0* 30.3* 29.7* 28.6*  MCV 90.0  --  87.9  --  86.7 87.6 87.9 88.5  PLT 218  --  190  --  146* 123* 143* 136*   < > = values in this interval not displayed.     Coagulation Studies: No results for input(s): LABPROT, INR in the last 72 hours.    7/10-11 EEG showed continuous generalized 3 to 6 Hz theta-delta slowing. Generalized periodic discharges with triphasic morphology at 0.5-1 Hz were also noted.  EEG was reactive to noxious stimulation.  Hyperventilation and photic stimulation were not performed.    ABNORMALITY - Periodic discharges with triphasic morphology, generalized ( GPDs) - Continuous slow, generalized IMPRESSION: This study is suggestive of severe diffuse encephalopathy, nonspecific etiology but likely related to toxic-metabolic etiology, anoxic/hypoxic brain injury. No seizures or definite epileptiform discharges were seen throughout the recording.  7/9-10 EEG: EEG showed continuous generalized 3 to 6 Hz theta-delta slowing. Generalized periodic discharges with triphasic morphology at 1-1.5 Hz were also noted.  EEG was reactive to  noxious stimulation.  Hyperventilation and photic stimulation were not performed.      ABNORMALITY - Periodic discharges with triphasic morphology, generalized ( GPDs) - Continuous slow, generalized   Impression: 40 year old female with prolonged downtime with severe hypoglycemia.  Her examination is markedly improved from yesterday and her imaging yesterday is reassuring against any new acute process (favor the SWI abnormalities on MRI to be either artifact or chronic, especially given her improving physical examination).  Additionally while EEG continues to show generalized periodic discharges the frequency of these has improved and there is no evolution into seizure activity  Recommendations: - discontinue LTM - neurology will be available on an as-needed basis going forward, should the patient have a significant decline please repage for reevaluation - discussed at length with sister-in-law Candace, expect gradual recovery on the order of weeks to months and even years.  Next up in rehabilitation will depend on whether or not ventilator can be successfully weaned, and signs of cortical function on exam remained promising although anticipate she will need a significant level of care for the foreseeable future - Appreciate ventilator weaning by critical care team and management of the rest of her comorbidities  Brooke Dare MD-PhD [facility] [phone]   If 7pm- 7am, please page neurology on call as listed in AMION.  CRITICAL CARE Performed by: Gordy Councilman  Total critical care time: 45 minutes  Critical care time was exclusive of separately billable procedures and treating other patients.  Critical care was necessary to treat or prevent imminent or life-threatening deterioration.  Critical care was time spent personally by me on the following activities: development of treatment plan with patient and/or surrogate as well as nursing, discussions with consultants,  evaluation of patient's response to treatment, examination of patient, obtaining history from patient or surrogate, ordering and performing treatments and interventions, ordering and review of laboratory studies, ordering and review of radiographic studies, pulse oximetry and re-evaluation of patient's condition.  Discussed with Dr. Judeth Horn, family, nursing.

## 2021-06-20 NOTE — Progress Notes (Signed)
NAME:  Sonia Howard, MRN:  [id number], DOB:  Feb 07, 1981, LOS: 5 ADMISSION DATE:  06/15/2021, CONSULTATION DATE: 06/15/2021 REFERRING MD:  Maia Plan, MD , CHIEF COMPLAINT: Found unresponsive  History of Present Illness:  Patient is intubated and unresponsive, most of the history is taken from chart review  40 year old female with history of drug overdose and unintentional weight loss who was brought into the emergency department after she was found unresponsive at her friend's house, her last known well was 5 PM yesterday.  Patient's vital signs were stable per EMS report but she was not responsive, her blood sugar was 23, she was given 2 A of D50 with that blood sugar improved to 250, she was given 2 mg of Narcan with no response.  Initially she received bag valve mask ventilation, in the emergency department she was intubated due to her GCS score being 3, in the emergency department patient blood sugar again was noted to be 12, she received D50 x2 and was started on D10. PCCM was consulted for help with evaluation and management  Pertinent  Medical History  Unintentional weight loss   Significant Hospital Events: Including procedures, antibiotic start and stop dates in addition to other pertinent events   7/6 admitted and intubated 7/10 remains intubate, MRI, EEG neg, slowly improving neuro exam, off sedation   Interim History / Subjective:   Remains critically ill. Intubated on life support. Withdraws to pain, some spontaneous movements. Upgoing babinskis.  Objective   Blood pressure 120/73, pulse 93, temperature 98.9 F (37.2 C), temperature source Axillary, resp. rate 20, height 5\' 8"  (1.727 m), weight 55.2 kg, SpO2 96 %.    Vent Mode: PRVC FiO2 (%):  [30 %-40 %] 40 % Set Rate:  [18 bmp] 18 bmp Vt Set:  [510 mL] 510 mL PEEP:  [5 cmH20] 5 cmH20 Plateau Pressure:  [12 cmH20-17 cmH20] 14 cmH20   Intake/Output Summary (Last 24 hours) at 06/20/2021 0914 Last data filed at  06/20/2021 0600 Gross per 24 hour  Intake 1552.2 ml  Output 1425 ml  Net 127.2 ml    Filed Weights   06/18/21 0500 06/19/21 0500 06/20/21 0340  Weight: 55.5 kg 55 kg 55.2 kg    Examination: General appearance: 40 y.o., female intubated on life support, critically ill  HENT: NCAT, ett in place Neck: trachea midline, CVL in place  Lungs: BL vented breaths  CV: RRR, s1 s2 Abdomen: soft, nt nd  Extremities: no peripheral edema  Skin: no rash  Neuro: off sedation, moving feet spontaneously, eyes flicker to voice, sternal rub, winces to pain does not localize, upgoing babinski bilaterally but moves contralateral foot at same time  Resolved Hospital Problem list     Assessment & Plan:   Coma, ?prolonged down time, possible anoxic brain injury, presumed drug overdose- exam concerning for profound neurological injury.  Found hypoglycemic, initial CT head neg.  MRI unrevealing. EEG no seizure. Recent MRI, CTA head ok. Repeat VEEG pending. Shock, troponin leak, possible stress induced cardiomyopathy- formal echo more c/w stress cardiomyopathy; given uncertain neurological prognosis, no role for LHC at this time; cardiology signed off, s/p heparin drip. Aspiration pneumonia, bilateral lower lungs  Acute hypoxemic respiratory failure requiring intubation and mechanical ventilation  Known Hx of IVDA Leukopenia  Plan:  Holding continuous sedation Avoiding benzos Prn opiates if needed  Complete 7 days ceftriaxone for Pna Repeat vEEG   Best Practice (right click and "Reselect all SmartList Selections" daily)   Diet/type: TF DVT prophylaxis:  heparin GI prophylaxis: PPI Lines: foley, central line Code Status:  full code Last date of multidisciplinary goals of care discussion- 7/7, get more info  This patient is critically ill with multiple organ system failure; which, requires frequent high complexity decision making, assessment, support, evaluation, and titration of therapies. This  was completed through the application of advanced monitoring technologies and extensive interpretation of multiple databases. During this encounter critical care time was devoted to patient care services described in this note for 35 minutes.  Karren Burly, MD  Pulmonary Critical Care 06/20/2021 9:14 AM

## 2021-06-20 NOTE — Procedures (Signed)
Cortrak  Person Inserting Tube:  Allyah Heather, RD Tube Type:  Cortrak - 43 inches Tube Size:  10 Tube Location:  Right nare Initial Placement:  Postpyloric Secured by: Bridle Technique Used to Measure Tube Placement:  Marking at nare/corner of mouth Cortrak Secured At:  83 cm  Cortrak Tube Team Note:  Consult received to re-place a Cortrak feeding tube.   X-ray is required, abdominal x-ray has been ordered by the Cortrak team. Please confirm tube placement before using the Cortrak tube.   If the tube becomes dislodged please keep the tube and contact the Cortrak team at [email or web address] ([email or web address]) for replacement.  If after hours and replacement cannot be delayed, place a NG tube and confirm placement with an abdominal x-ray.    Vanessa Kick MS, RD, LDN, CNSC Clinical Nutrition Pager listed in AMION

## 2021-06-20 NOTE — Procedures (Addendum)
Patient Name: Sonia Howard  MRN: [id number]  Epilepsy Attending: Charlsie Quest  Referring Physician/Provider: Dr. Brooke Dare Duration: 06/19/2021 2229 to 06/20/2021 1830   Patient history: 40 year old female with history of drug overdose and unintentional weight loss brought in after she was found unresponsive at a friend's home.  Blood sugar was 23.  EEG to evaluate for seizures.   Level of alertness: Comatose   AEDs during EEG study: None   Technical aspects: This EEG study was done with scalp electrodes positioned according to the 10-20 International system of electrode placement. Electrical activity was acquired at a sampling rate of 500Hz  and reviewed with a high frequency filter of 70Hz  and a low frequency filter of 1Hz . EEG data were recorded continuously and digitally stored.   Description: EEG showed continuous generalized 3 to 6 Hz theta-delta slowing. Generalized periodic discharges with triphasic morphology at 0.5-1 Hz were also noted.  EEG was reactive to noxious stimulation.  Hyperventilation and photic stimulation were not performed.      ABNORMALITY - Periodic discharges with triphasic morphology, generalized ( GPDs) - Continuous slow, generalized   IMPRESSION: This study is suggestive of severe diffuse encephalopathy, nonspecific etiology but likely related to toxic-metabolic etiology, anoxic/hypoxic brain injury. No seizures or definite epileptiform discharges were seen throughout the recording.   [name] 

## 2021-06-20 NOTE — Progress Notes (Signed)
Transported pt to CT and back to room 3M05 on vent. No issues. RT will cont. To monitor

## 2021-06-21 LAB — CBC
HCT: 28.9 % — ABNORMAL LOW (ref 36.0–46.0)
Hemoglobin: 8.9 g/dL — ABNORMAL LOW (ref 12.0–15.0)
MCH: 27.7 pg (ref 26.0–34.0)
MCHC: 30.8 g/dL (ref 30.0–36.0)
MCV: 90 fL (ref 80.0–100.0)
Platelets: 147 10*3/uL — ABNORMAL LOW (ref 150–400)
RBC: 3.21 MIL/uL — ABNORMAL LOW (ref 3.87–5.11)
RDW: 20.8 % — ABNORMAL HIGH (ref 11.5–15.5)
WBC: 6.8 10*3/uL (ref 4.0–10.5)
nRBC: 0 % (ref 0.0–0.2)

## 2021-06-21 LAB — COMPREHENSIVE METABOLIC PANEL
ALT: 31 U/L (ref 0–44)
AST: 26 U/L (ref 15–41)
Albumin: 1.6 g/dL — ABNORMAL LOW (ref 3.5–5.0)
Alkaline Phosphatase: 65 U/L (ref 38–126)
Anion gap: 3 — ABNORMAL LOW (ref 5–15)
BUN: 8 mg/dL (ref 6–20)
CO2: 33 mmol/L — ABNORMAL HIGH (ref 22–32)
Calcium: 8.1 mg/dL — ABNORMAL LOW (ref 8.9–10.3)
Chloride: 106 mmol/L (ref 98–111)
Creatinine, Ser: 0.33 mg/dL — ABNORMAL LOW (ref 0.44–1.00)
GFR, Estimated: >60 mL/min (ref >60)
Glucose, Bld: 127 mg/dL — ABNORMAL HIGH (ref 70–99)
Potassium: 3.6 mmol/L (ref 3.5–5.1)
Sodium: 142 mmol/L (ref 135–145)
Total Bilirubin: 0.3 mg/dL (ref 0.3–1.2)
Total Protein: 5.2 g/dL — ABNORMAL LOW (ref 6.5–8.1)

## 2021-06-21 LAB — GLUCOSE, CAPILLARY
Glucose-Capillary: 115 mg/dL — ABNORMAL HIGH (ref 70–99)
Glucose-Capillary: 122 mg/dL — ABNORMAL HIGH (ref 70–99)
Glucose-Capillary: 125 mg/dL — ABNORMAL HIGH (ref 70–99)
Glucose-Capillary: 133 mg/dL — ABNORMAL HIGH (ref 70–99)
Glucose-Capillary: 137 mg/dL — ABNORMAL HIGH (ref 70–99)
Glucose-Capillary: 137 mg/dL — ABNORMAL HIGH (ref 70–99)

## 2021-06-21 LAB — CULTURE, BLOOD (ROUTINE X 2)
Culture: NO GROWTH
Culture: NO GROWTH
Special Requests: ADEQUATE
Special Requests: ADEQUATE

## 2021-06-21 MED ORDER — BETHANECHOL CHLORIDE 10 MG PO TABS
10.0000 mg | ORAL_TABLET | Freq: Three times a day (TID) | ORAL | Status: AC
  Administered 2021-06-21 – 2021-06-24 (×9): 10 mg
  Filled 2021-06-21 (×9): qty 1

## 2021-06-21 MED ORDER — NUTRISOURCE FIBER PO PACK
1.0000 | PACK | Freq: Two times a day (BID) | ORAL | Status: DC
  Administered 2021-06-21 – 2021-06-25 (×9): 1
  Filled 2021-06-21 (×10): qty 1

## 2021-06-21 MED ORDER — FUROSEMIDE 10 MG/ML IJ SOLN
20.0000 mg | Freq: Once | INTRAMUSCULAR | Status: AC
  Administered 2021-06-21: 20 mg via INTRAVENOUS
  Filled 2021-06-21: qty 2

## 2021-06-21 NOTE — Progress Notes (Addendum)
NAME:  Sonia Howard, MRN:  [id number], DOB:  1981/05/12, LOS: 6 ADMISSION DATE:  06/15/2021, CONSULTATION DATE: 06/15/2021 REFERRING MD:  Maia Plan, MD , CHIEF COMPLAINT: Found unresponsive  History of Present Illness:  Patient is intubated and unresponsive, most of the history is taken from chart review  40 year old female with history of drug overdose and unintentional weight loss who was brought into the emergency department after she was found unresponsive at her friend's house, her last known well was 5 PM yesterday.  Patient's vital signs were stable per EMS report but she was not responsive, her blood sugar was 23, she was given 2 A of D50 with that blood sugar improved to 250, she was given 2 mg of Narcan with no response.  Initially she received bag valve mask ventilation, in the emergency department she was intubated due to her GCS score being 3, in the emergency department patient blood sugar again was noted to be 12, she received D50 x2 and was started on D10. PCCM was consulted for help with evaluation and management  Collateral history per patient's brother: patient has a history of opioid use disorder, specifically fentanyl. Her father passed away in 18-Mar-2023, and per brother she was found trying to remove some of the fentanyl from her father's port line. She went to [facility] for rehab for 6 weeks in the beginning of April and was put on suboxone. She is legally married to [employer] but have been separated around a year. Per brother, he does not want to have anything to do with her. Patient was living with her friend Mardelle Matte, and Andy's mom who also have a history of IVDU. Patient was found unresponsive by Andy's mom who called EMS.   Pertinent  Medical History  Unintentional weight loss, drug overdose, heroin and opioid use, chronic migraine, bariatric surgery   Significant Hospital Events: Including procedures, antibiotic start and stop dates in addition to  other pertinent events   7/6 admitted and intubated 7/7 ceftriaxone started 7/10 remains intubated, MRI, EEG neg, slowly improving neuro exam, off sedation  Interim History / Subjective:   Remains critically ill. Intubated on life support. Responds to pain but does not withdraw. Some spontaneous movements. Upgoing babinskis and positive cross abductors.   Objective   Blood pressure 119/70, pulse 94, temperature 98.8 F (37.1 C), temperature source Axillary, resp. rate 18, height 5\' 8"  (1.727 m), weight 52.4 kg, SpO2 95 %.    Vent Mode: CPAP;PSV FiO2 (%):  [40 %] 40 % Set Rate:  [18 bmp] 18 bmp Vt Set:  [510 mL] 510 mL PEEP:  [5 cmH20] 5 cmH20 Pressure Support:  [8 cmH20-10 cmH20] 8 cmH20 Plateau Pressure:  [14 cmH20-15 cmH20] 15 cmH20   Intake/Output Summary (Last 24 hours) at 06/21/2021 1022 Last data filed at 06/21/2021 1000 Gross per 24 hour  Intake 1871.88 ml  Output 1675 ml  Net 196.88 ml    Filed Weights   06/19/21 0500 06/20/21 0340 06/21/21 0321  Weight: 55 kg 55.2 kg 52.4 kg    Examination: General appearance: 40 y.o., female intubated on life support, critically ill  HENT: NCAT, ett in place, pupils dilated to 7 mm, reactive bilaterally. Neck: trachea midline, CVL in place  Lungs: BL vented breaths  CV: RRR, s1 s2 Abdomen: soft, nt nd  Extremities: 2+ pitting edema in upper and lower extremities   Skin: no rash  Neuro: off sedation, moving feet to stimulation on plantar aspect, right pupil sluggish,  concentric pupillary reflex intact, corneal reflex intact, cough reflex intact, opens eyes to sternal rub, does not withdraw to pain, upgoing babinski bilaterally, brisk patellar reflex bilaterally.   Resolved Hospital Problem list   -Hypoglycemia -Leukopenia  Assessment & Plan:   Coma, ?prolonged down time, possible anoxic brain injury, presumed drug overdose- exam concerning for profound neurological injury.  Found hypoglycemic. Initial CT head neg. MRI  unrevealing. EEG no seizure. Recent MRI, CTA head ok. Patient status improving gradually. Neurology discussed with family regarding prognosis of long recovery (months)/ -neurology following as needed -hold continuous sedation -avoid benzos -monitor electrolytes and replenish as needed -remove central line  Hypoglycemia- Glucose 12 in ED and received D50x2 and D10 drip. Glucose stable now.  C-peptide low, proinsulin/insulin ratio pending. -Monitor blood glucose daily  Shock, troponin leak, possible stress induced cardiomyopathy- formal echo more c/w stress cardiomyopathy; given uncertain neurological prognosis, no role for LHC at this time; cardiology signed off, s/p heparin drip. -d/c atorvastatin -d/c ASA 81 mg qd -Lasix 20 mg IV x1  Aspiration pneumonia, bilateral lower lungs- bilateral opacities on 7/7 CXR.  -finish Ceftriaxone 2 g q24h today  Acute hypoxemic respiratory failure requiring intubation and mechanical ventilation- vent originally set to [facility]. Starting to wean with PS/CPAP. Consider trach collar in a few days if patient demonstrates continued need for ventilator support long-term.  Known Hx of IVDA -fentanyl 25-100 mcg prn  DVT prophylaxis: -enoxaparin 40 mg qd  GI prophylaxis: -Protonix 40mg  qd  Frequent stool: stooling 7x/day. Possibly due to opioid withdrawal or tube feeding diet. -Fiber packet BID -Consider Jevity feeding supplement if still having frequent stools  Diet: -Cortrak in place -feeding supplement Prosource TF 45 mL qd -VITAL 1.5 CAL continuous feeding supplement -multivitamin with minerals 1 tablet BID -calcium carbonate 250 mg TID -thiamine 100 mg qd -Vit C lab pending  Best Practice (right click and "Reselect all SmartList Selections" daily)   Diet/type: TF DVT prophylaxis: heparin GI prophylaxis: PPI Lines: foley, central line Code Status:  full code Last date of multidisciplinary goals of care discussion- 7/7, get more  info  Critical care attending attestation note:  Patient seen and examined and relevant ancillary tests reviewed.  I agree with the assessment and plan of care as outlined by the medical student.   40 year old with encephalopathy and profound hypoglycemia.   Severe neurologic injury. Slow improvement but nowhere near ready to liberate from ventilator. Mild volume overload on exam. Lasix today.  Synopsis of assessment and plan:  Anoxic vs hypoglycemic injury: Slow recovery. Severe encephalopathy persists.  --avoid sedatives --time to recover  Reduced EF: Stress related.  --stop aspirin, stop statin  Aspiration Pna: --complete abx 7/13  CRITICAL CARE Performed by: 8/13 Ladanian Kelter   Total critical care time: 32 minutes  Critical care time was exclusive of separately billable procedures and treating other patients.  Critical care was necessary to treat or prevent imminent or life-threatening deterioration.  Critical care was time spent personally by me on the following activities: development of treatment plan with patient and/or surrogate as well as nursing, discussions with consultants, evaluation of patient's response to treatment, examination of patient, obtaining history from patient or surrogate, ordering and performing treatments and interventions, ordering and review of laboratory studies, ordering and review of radiographic studies, pulse oximetry, re-evaluation of patient's condition and participation in multidisciplinary rounds.  Lesia Sago, MD See Amion for contact info  06/21/2021, 1:59 PM

## 2021-06-22 ENCOUNTER — Inpatient Hospital Stay (HOSPITAL_COMMUNITY): Payer: Medicaid Other

## 2021-06-22 LAB — GLUCOSE, CAPILLARY
Glucose-Capillary: 138 mg/dL — ABNORMAL HIGH (ref 70–99)
Glucose-Capillary: 123 mg/dL — ABNORMAL HIGH (ref 70–99)

## 2021-06-22 LAB — COMPREHENSIVE METABOLIC PANEL
ALT: 36 U/L (ref 0–44)
AST: 31 U/L (ref 15–41)
Albumin: 1.7 g/dL — ABNORMAL LOW (ref 3.5–5.0)
Alkaline Phosphatase: 57 U/L (ref 38–126)
Anion gap: 5 (ref 5–15)
BUN: 10 mg/dL (ref 6–20)
CO2: 34 mmol/L — ABNORMAL HIGH (ref 22–32)
Calcium: 8.2 mg/dL — ABNORMAL LOW (ref 8.9–10.3)
Chloride: 104 mmol/L (ref 98–111)
Creatinine, Ser: 0.36 mg/dL — ABNORMAL LOW (ref 0.44–1.00)
GFR, Estimated: >60 mL/min (ref >60)
Glucose, Bld: 125 mg/dL — ABNORMAL HIGH (ref 70–99)
Potassium: 3.6 mmol/L (ref 3.5–5.1)
Sodium: 143 mmol/L (ref 135–145)
Total Bilirubin: 0.5 mg/dL (ref 0.3–1.2)
Total Protein: 5.6 g/dL — ABNORMAL LOW (ref 6.5–8.1)

## 2021-06-22 LAB — CBC
HCT: 28.8 % — ABNORMAL LOW (ref 36.0–46.0)
Hemoglobin: 9.2 g/dL — ABNORMAL LOW (ref 12.0–15.0)
MCH: 28.2 pg (ref 26.0–34.0)
MCHC: 31.9 g/dL (ref 30.0–36.0)
MCV: 88.3 fL (ref 80.0–100.0)
Platelets: 174 10*3/uL (ref 150–400)
RBC: 3.26 MIL/uL — ABNORMAL LOW (ref 3.87–5.11)
RDW: 20.8 % — ABNORMAL HIGH (ref 11.5–15.5)
WBC: 8.3 10*3/uL (ref 4.0–10.5)
nRBC: 0 % (ref 0.0–0.2)

## 2021-06-22 IMAGING — DX DG ABD PORTABLE 1V
2 series · 2 of 2 positions shown · non-contrast
Comparison: [DATE]

CLINICAL DATA: Nausea and vomiting

EXAM:
PORTABLE ABDOMEN - 1 VIEW

[abdomen supine (1 of 2)]
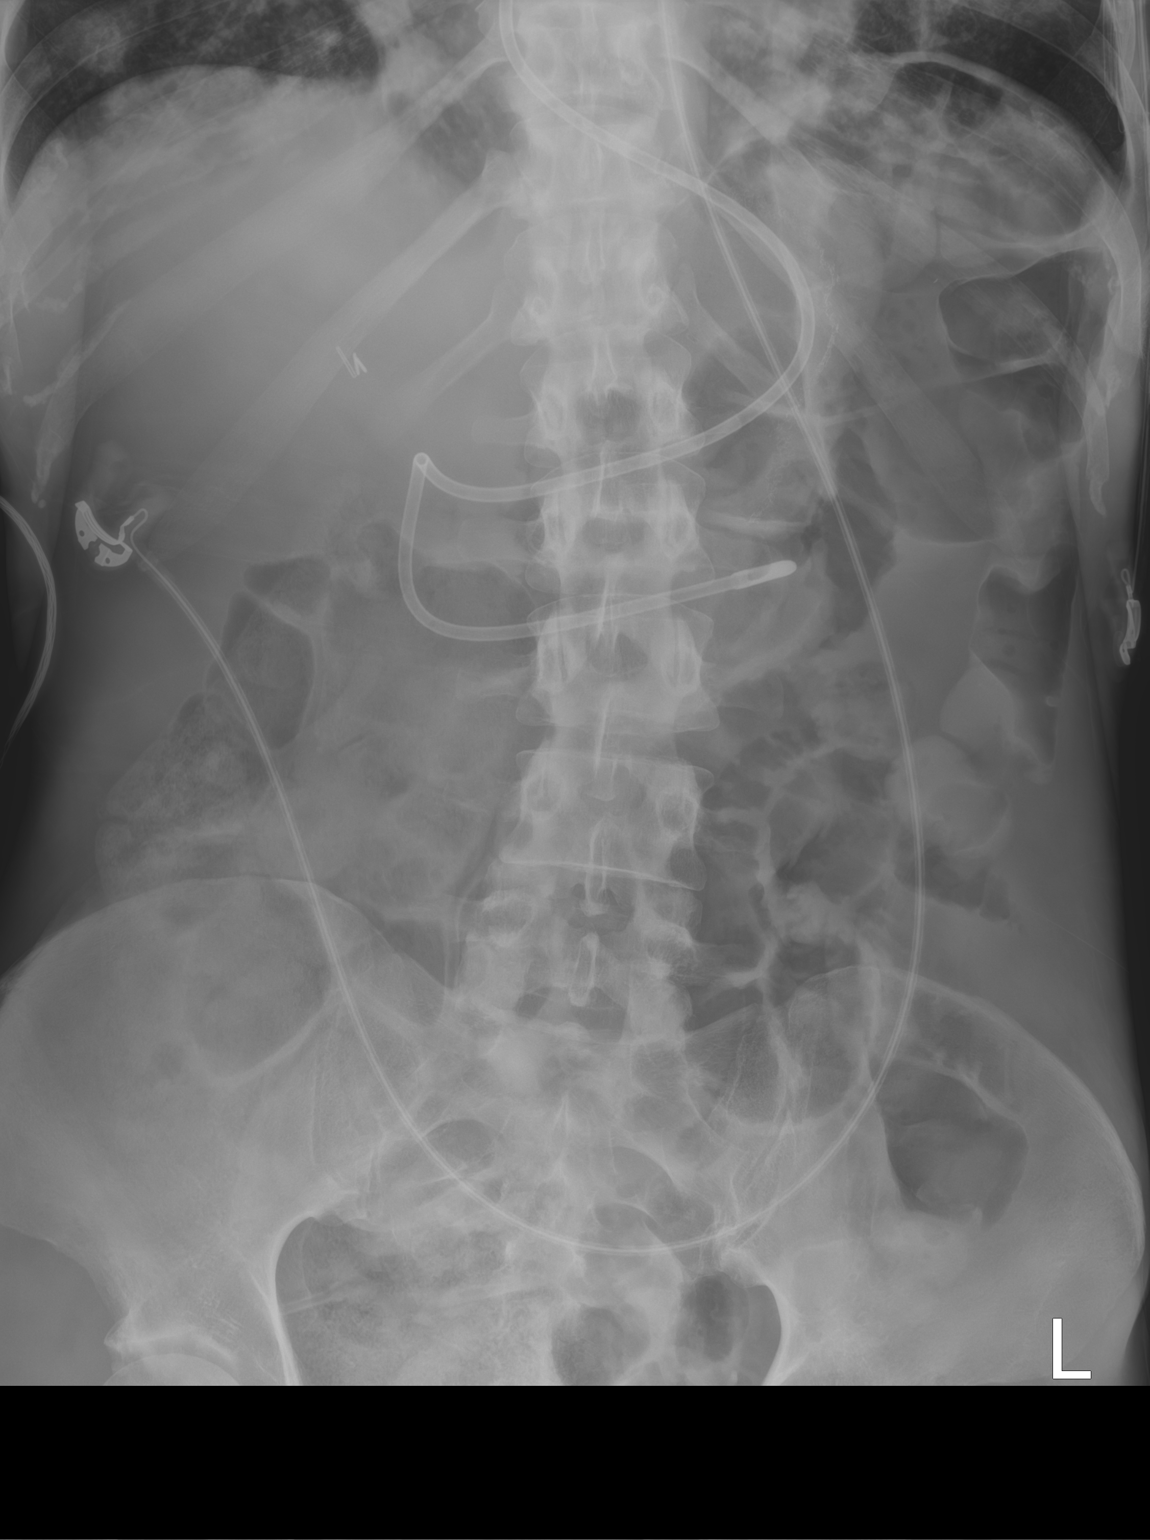

[abdomen supine (2 of 2)]
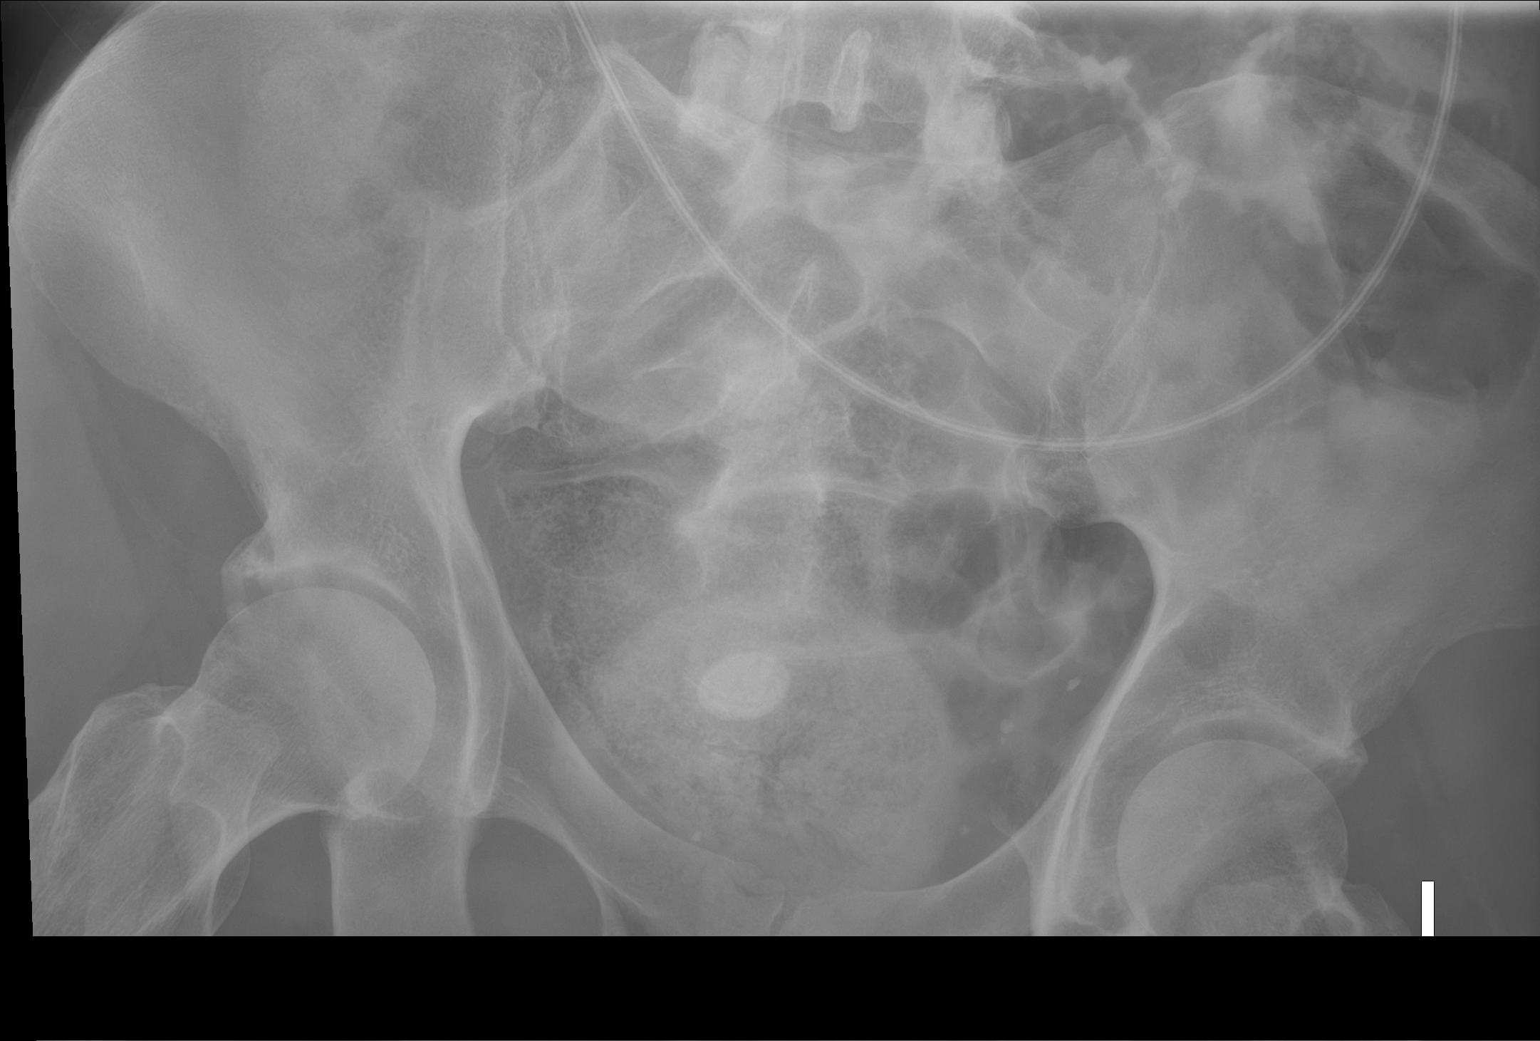

[2 of 2 positions shown; findings below may reference images not displayed]

FINDINGS: Weighted feeding catheter is again noted in the fourth portion of
the duodenum. Scattered large and small bowel gas is noted.
Calcification is noted in the midline of the pelvis likely
representing a bladder stone. This measures 17 mm in diameter. No
acute bony abnormality is noted.
IMPRESSION: Central bladder calculus within the pelvis.

No obstructive changes are noted.

## 2021-06-22 MED ORDER — VITAL 1.5 CAL PO LIQD
1000.0000 mL | ORAL | Status: DC
  Administered 2021-06-22 – 2021-06-28 (×6): 1000 mL
  Filled 2021-06-22 (×2): qty 1000

## 2021-06-22 MED ORDER — POTASSIUM CHLORIDE 20 MEQ PO PACK
40.0000 meq | PACK | Freq: Once | ORAL | Status: AC
  Administered 2021-06-22: 40 meq
  Filled 2021-06-22: qty 2

## 2021-06-22 MED ORDER — NYSTATIN 100000 UNIT/ML MT SUSP
5.0000 mL | Freq: Four times a day (QID) | OROMUCOSAL | Status: DC
  Administered 2021-06-22 – 2021-06-28 (×24): 500000 [IU] via OROMUCOSAL
  Filled 2021-06-22 (×22): qty 5

## 2021-06-22 MED ORDER — FUROSEMIDE 10 MG/ML IJ SOLN
20.0000 mg | Freq: Once | INTRAMUSCULAR | Status: AC
  Administered 2021-06-22: 20 mg via INTRAVENOUS
  Filled 2021-06-22: qty 2

## 2021-06-22 NOTE — Progress Notes (Addendum)
NAME:  Sonia Howard, MRN:  [id number], DOB:  01/22/1981, LOS: 7 ADMISSION DATE:  06/15/2021, CONSULTATION DATE: 06/15/2021 REFERRING MD:  Maia Plan, MD , CHIEF COMPLAINT: Found unresponsive  History of Present Illness:  Patient is intubated and unresponsive, most of the history is taken from chart review  40 year old female with history of drug overdose and unintentional weight loss who was brought into the emergency department after she was found unresponsive at her friend's house, her last known well was 5 PM yesterday.  Patient's vital signs were stable per EMS report but she was not responsive, her blood sugar was 23, she was given 2 A of D50 with that blood sugar improved to 250, she was given 2 mg of Narcan with no response.  Initially she received bag valve mask ventilation, in the emergency department she was intubated due to her GCS score being 3, in the emergency department patient blood sugar again was noted to be 12, she received D50 x2 and was started on D10. PCCM was consulted for help with evaluation and management  Collateral history per patient's brother: patient has a history of opioid use disorder, specifically fentanyl. Her father passed away in 03/07/2023, and per brother she was found trying to remove some of the fentanyl from her father's port line. She went to [facility] for rehab for 6 weeks in the beginning of April and was put on suboxone. She is legally married to [employer] but have been separated around a year. Per brother, he does not want to have anything to do with her. Patient was living with her friend Mardelle Matte, and Andy's mom who also have a history of IVDU. Patient was found unresponsive by Andy's mom who called EMS.   Pertinent  Medical History  Unintentional weight loss, drug overdose, heroin and opioid use, chronic migraine, bariatric surgery   Significant Hospital Events: Including procedures, antibiotic start and stop dates in addition to  other pertinent events   7/6 admitted and intubated 7/7 ceftriaxone started 7/10 remains intubated, MRI, EEG neg, slowly improving neuro exam, off sedation 7/12 ceftriaxone finished  Interim History / Subjective:   Remains critically ill and not responsive to commands. Intubated on life support. Startles to voice, seems to possibly attend for a few seconds. Grimaces and withdraws to pain. Some spontaneous movements. Opens eyes to verbal stimulation. Upgoing babinskis. Pitting edema noted. Central line removed.  Objective   Blood pressure 128/72, pulse 97, temperature 99.9 F (37.7 C), temperature source Axillary, resp. rate (!) 22, height 5\' 8"  (1.727 m), weight 50.9 kg, SpO2 97 %.    Vent Mode: CPAP;PSV FiO2 (%):  [40 %] 40 % Set Rate:  [18 bmp] 18 bmp Vt Set:  [510 mL] 510 mL PEEP:  [5 cmH20] 5 cmH20 Pressure Support:  [8 cmH20-10 cmH20] 8 cmH20 Plateau Pressure:  [12 cmH20-14 cmH20] 14 cmH20   Intake/Output Summary (Last 24 hours) at 06/22/2021 0935 Last data filed at 06/22/2021 0600 Gross per 24 hour  Intake 2057.6 ml  Output 2300 ml  Net -242.4 ml    Filed Weights   06/20/21 0340 06/21/21 0321 06/22/21 0500  Weight: 55.2 kg 52.4 kg 50.9 kg    Examination: General appearance: 40 y.o., female intubated on life support, critically ill  HENT: NCAT, ett in place, pupils dilated to 7 mm, reactive bilaterally. Neck: trachea midline, CVL in place  Lungs: BL vented breaths  CV: RRR, s1 s2 Abdomen: soft, nt nd  Extremities: 2+ pitting edema  in upper and lower extremities   Skin: no rash  Neuro: startles to voice, does not follow commands, seems to attend for a few seconds, off sedation, moving feet to stimulation on plantar aspect, concentric pupillary reflex intact, corneal reflex intact, cough reflex intact, opens eyes to sternal rub, withdraw to pain, upgoing babinski bilaterally, brisk patellar reflex bilaterally.   Resolved Hospital Problem list    -Hypoglycemia -Leukopenia -Shock (possible stress cardiomyopathy)  Assessment & Plan:  Labs Pending: -Vit C -Proinsulin/Insulin ratio  Coma, ?prolonged down time, possible anoxic brain injury, presumed drug overdose- exam concerning for profound neurological injury.  Found hypoglycemic. Initial CT head neg. MRI unrevealing. EEG no seizure. Recent MRI, CTA head ok. Patient status improving gradually. Neurology discussed with family regarding prognosis of long recovery (months) -neurology following as needed -hold continuous sedation -avoid benzos -monitor electrolytes and replenish as needed  Hypoglycemia- Glucose 12 in ED and received D50x2 and D10 drip. Glucose stable now.  C-peptide low. -Monitor blood glucose daily -d/c capillary blood glucose q4h  Hypervolemia- 2+ pitting edema. Remains hypervolemic (net +3.9 L since admission) -repeat Lasix 20 mg IV -bethanechol (Urecholine) 10 mg TID (1/3 days)  Aspiration pneumonia, bilateral lower lungs- bilateral opacities on 7/7 CXR.  -finished Ceftriaxone 7/12  Acute hypoxemic respiratory failure requiring intubation and mechanical ventilation- vent originally set to [facility]. Starting to wean with PS/CPAP. Consider trach collar in a few days if patient demonstrates continued need for ventilator support long-term.  Known Hx of IVDA -fentanyl 25-100 mcg prn  DVT prophylaxis: -enoxaparin 40 mg qd  GI prophylaxis: -Protonix 40mg  qd  Frequent stool: Likely combo of  opioid withdrawal or tube feeding diet. -Fiber packet BID  Diet: -Cortrak in place -feeding supplement Prosource TF 45 mL qd -VITAL 1.5 CAL continuous feeding supplement -multivitamin with minerals 1 tablet BID -d/c calcium carbonate 250 mg TID -thiamine 100 mg qd  Best Practice (right click and "Reselect all SmartList Selections" daily)   Diet/type: TF DVT prophylaxis: heparin GI prophylaxis: PPI Lines: foley, central line Code Status:  full code Last date of  multidisciplinary goals of care discussion- 7/7, get more info  CRITICAL CARE Performed by: 9/7 Jamilette Suchocki   Total critical care time: 32 minutes  Critical care time was exclusive of separately billable procedures and treating other patients.  Critical care was necessary to treat or prevent imminent or life-threatening deterioration.  Critical care was time spent personally by me on the following activities: development of treatment plan with patient and/or surrogate as well as nursing, discussions with consultants, evaluation of patient's response to treatment, examination of patient, obtaining history from patient or surrogate, ordering and performing treatments and interventions, ordering and review of laboratory studies, ordering and review of radiographic studies, pulse oximetry and re-evaluation of patient's condition.

## 2021-06-22 NOTE — Progress Notes (Addendum)
Password updated per pt's brother Juliann Pulse to [phone]

## 2021-06-22 NOTE — Progress Notes (Signed)
Pharmacy Electrolyte Replacement  Recent Labs:  Recent Labs    06/22/21 0602  K 3.6  CREATININE 0.36*    Low Critical Values (K </= 2.5, Phos </= 1, Mg </= 1) Present: None   Plan:  - KCl 40 mEq PT x 1 - Recheck BMET with AM labs  Thank you for allowing pharmacy to be a part of this patient's care.  Georgina Pillion, PharmD, BCPS Clinical Pharmacist Clinical phone for 06/22/2021: [phone] 06/22/2021 8:40 AM   **Pharmacist phone directory can now be found on [email or web address] ([email or web address]).  Listed under [facility].

## 2021-06-22 NOTE — Progress Notes (Signed)
Nutrition Follow-up  DOCUMENTATION CODES:   Underweight, Severe malnutrition in context of social or environmental circumstances  INTERVENTION:   Continue tube feeds via Cortrak: - Increase Vital 1.5 to new goal rate of 55 ml/hr (1320 ml/day) - ProSource TF 45 ml daily  Tube feeding regimen provides 2020 kcal, 100 grams of protein, and 1008 ml of H2O.  - Continue MVI with minerals BID per tube   - Continue zinc sulfate 220 mg BID x 10 days for zinc deficiency  - Monitor for vitamin C lab result and supplement if deficient  NUTRITION DIAGNOSIS:   Severe Malnutrition related to social / environmental circumstances (polysubstance abuse) as evidenced by severe muscle depletion, severe fat depletion.  Ongoing, being addressed via TF  GOAL:   Patient will meet greater than or equal to 90% of their needs  Met via TF  MONITOR:   Vent status, Labs, Weight trends, TF tolerance, Skin, I & O's  REASON FOR ASSESSMENT:   Ventilator, Consult Enteral/tube feeding initiation and management  ASSESSMENT:   40 year old female who presented to the ED on 7/06 after being round unresponsive with very low blood sugar. PMH of IVDA, migraine, anxiety, depression, hypothyroidism, iron deficiency anemia, GERD, hx of overdose with opioid, tobacco abuse, sleeve gastrectomy in 2015. Pt required intubation.  7/08 - OG tube exchanged for post-pyloric Cortrak  7/11 - Cortrak repositioned as it was not flushing (tip post-pyloric)  Discussed pt with RN and during ICU rounds. Pt may need trach at some point. Pt tolerating current TF without issue. Due to weight loss despite pt still having pitting edema, will increase TF rate to better meet re-estimated needs.  Noted calcium carbonate was discontinued. Discussed with medical student and pharmacy. Corrected calcium is 10.04 which is WNL. Will continue to monitor calcium level   Admit weight: 52.7 kg Current weight: 50.9 kg  Difficult to determine  dry weight as pt still with deep pitting edema to BUE and moderate pitting edema to BLE.  Patient is currently intubated on ventilator support MV: 9.5 L/min Temp (24hrs), Avg:98.9 F (37.2 C), Min:98.4 F (36.9 C), Max:99.9 F (37.7 C)  Medications reviewed and include: nutrisource fiber BID, MVI with minerals BID, protonix, klor-con 40 mEq once, thiamine, zinc sulfate 220 mg BID  Vitamin/Mineral Profile: Thiamine B1: 103.7 (WNL) Vitamin B12: 304 (WNL) Folate B9: 6.6 (WNL) Vitamin D: 48.47 (WNL) Vitamin C: pending Zinc: 33 (low)  Labs reviewed: hemoglobin 9.2 CBG's: 115-138 x 24 hours  UOP: 2550 ml x 24 hours I/O's: +3.8 L since admit  Diet Order:   Diet Order             Diet NPO time specified  Diet effective now                   EDUCATION NEEDS:   Not appropriate for education at this time  Skin:  Skin Assessment: Skin Integrity Issues: Stage I: coccyx, L ischial tuberosity, R ischial tuberosity  Last BM:  06/21/21 type 6  Height:   Ht Readings from Last 1 Encounters:  06/15/21 5\' 8"  (1.727 m)    Weight:   Wt Readings from Last 1 Encounters:  06/22/21 50.9 kg    BMI:  Body mass index is 17.06 kg/m.  Estimated Nutritional Needs:   Kcal:  1950-2150  Protein:  90-110 grams  Fluid:  >/= 1.8 L    06/24/21, MS, RD, LDN Inpatient Clinical Dietitian Please see AMiON for contact information.

## 2021-06-22 NOTE — Progress Notes (Signed)
CBGs to be stopped per Dr. Judeth Horn

## 2021-06-23 ENCOUNTER — Inpatient Hospital Stay (HOSPITAL_COMMUNITY): Payer: Medicaid Other

## 2021-06-23 LAB — CBC
HCT: 29.7 % — ABNORMAL LOW (ref 36.0–46.0)
Hemoglobin: 9.3 g/dL — ABNORMAL LOW (ref 12.0–15.0)
MCH: 27.5 pg (ref 26.0–34.0)
MCHC: 31.3 g/dL (ref 30.0–36.0)
MCV: 87.9 fL (ref 80.0–100.0)
Platelets: 250 10*3/uL (ref 150–400)
RBC: 3.38 MIL/uL — ABNORMAL LOW (ref 3.87–5.11)
RDW: 20.4 % — ABNORMAL HIGH (ref 11.5–15.5)
WBC: 10.9 10*3/uL — ABNORMAL HIGH (ref 4.0–10.5)
nRBC: 0 % (ref 0.0–0.2)

## 2021-06-23 LAB — COMPREHENSIVE METABOLIC PANEL
ALT: 50 U/L — ABNORMAL HIGH (ref 0–44)
AST: 44 U/L — ABNORMAL HIGH (ref 15–41)
Albumin: 1.9 g/dL — ABNORMAL LOW (ref 3.5–5.0)
Alkaline Phosphatase: 69 U/L (ref 38–126)
Anion gap: 8 (ref 5–15)
BUN: 11 mg/dL (ref 6–20)
CO2: 34 mmol/L — ABNORMAL HIGH (ref 22–32)
Calcium: 8.6 mg/dL — ABNORMAL LOW (ref 8.9–10.3)
Chloride: 101 mmol/L (ref 98–111)
Creatinine, Ser: 0.34 mg/dL — ABNORMAL LOW (ref 0.44–1.00)
GFR, Estimated: >60 mL/min (ref >60)
Glucose, Bld: 115 mg/dL — ABNORMAL HIGH (ref 70–99)
Potassium: 3 mmol/L — ABNORMAL LOW (ref 3.5–5.1)
Sodium: 143 mmol/L (ref 135–145)
Total Bilirubin: 0.5 mg/dL (ref 0.3–1.2)
Total Protein: 6.3 g/dL — ABNORMAL LOW (ref 6.5–8.1)

## 2021-06-23 LAB — MAGNESIUM: Magnesium: 2.1 mg/dL (ref 1.7–2.4)

## 2021-06-23 LAB — VITAMIN C: Vitamin C: <0.1 mg/dL — ABNORMAL LOW (ref 0.4–2.0)

## 2021-06-23 IMAGING — DX DG CHEST 1V PORT
2 series · 2 of 2 positions shown · non-contrast
Comparison: Radiograph [DATE], abdominal radiograph [DATE]

CLINICAL DATA: Intubation

EXAM:
PORTABLE CHEST 1 VIEW

[chest ap (1 of 2)]
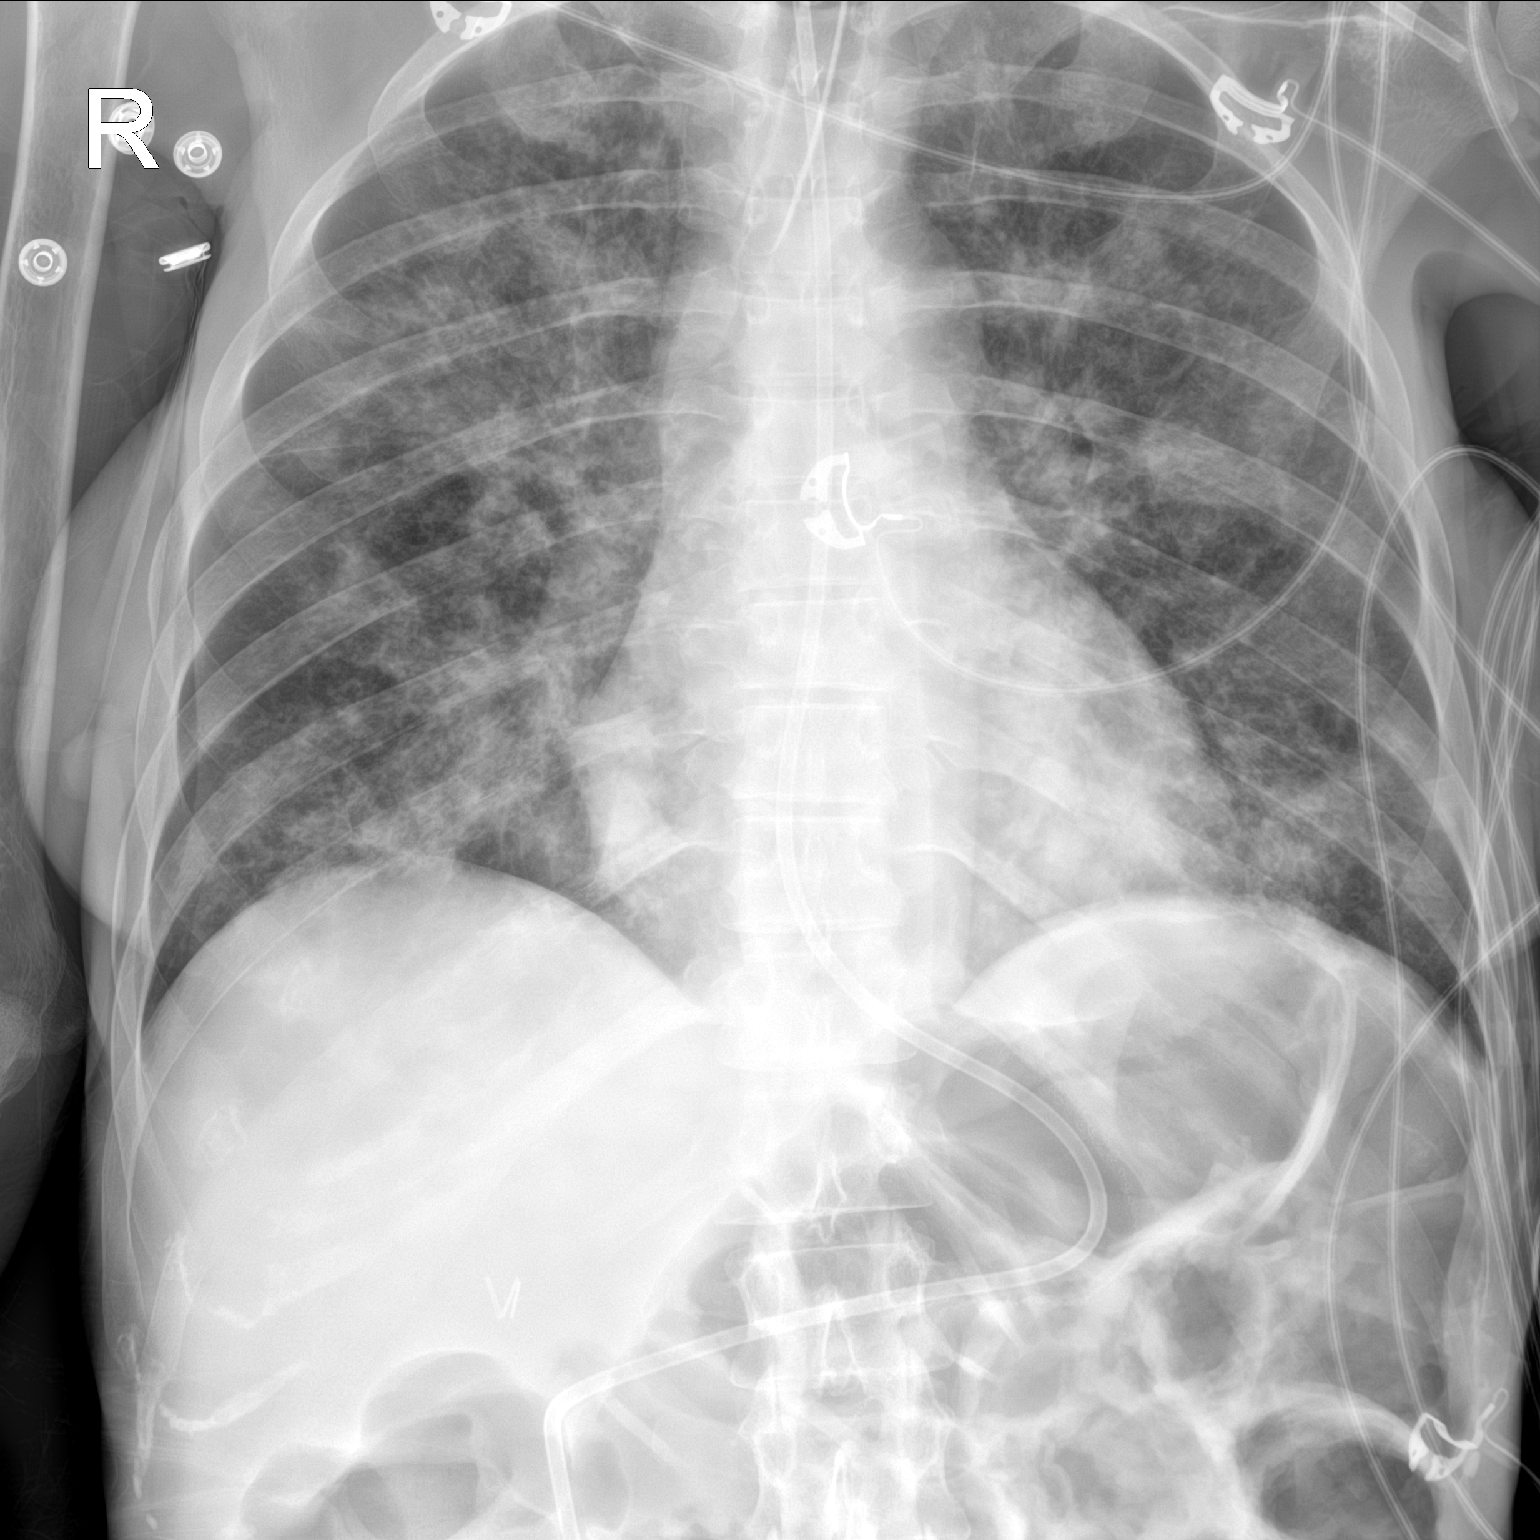

[chest ap (2 of 2)]
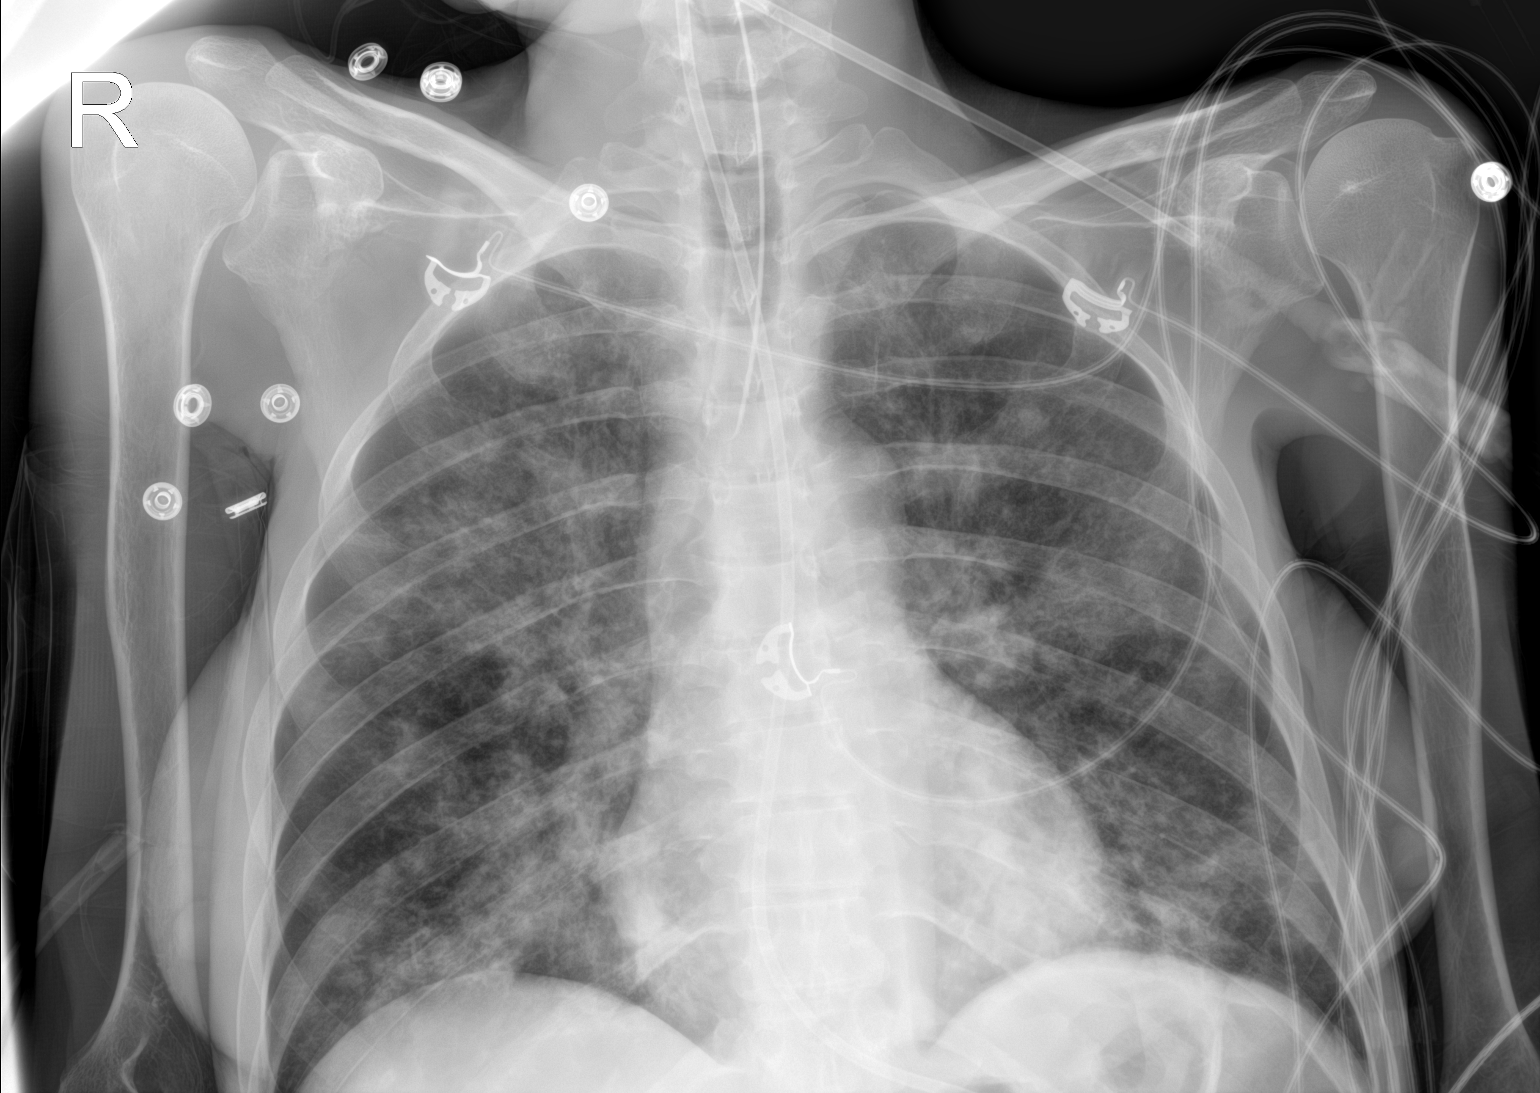

[2 of 2 positions shown; findings below may reference images not displayed]

FINDINGS: Endotracheal tube tip terminates 3.4 cm the carina. Transesophageal
tube tip terminates below the margin of imaging, likely within the
duodenal sweep. Telemetry leads overlie the chest. Surgical clips
noted in the right upper quadrant. Persistent bilateral hazy and
patchy opacities are again seen throughout both lungs, with minimal
if any significant interval clearing. No pneumothorax or effusion.
Stable cardiomediastinal contours. Question some increasing air
distention of loops of bowel in the upper abdomen. No acute osseous
or chest wall abnormality.
IMPRESSION: Endotracheal tube tip terminates 3.4 cm from the carina.

Transesophageal tube tip terminates below the margins of imaging,
likely within the duodenal sweep.

Persistent bilateral hazy and patchy opacities, with minimal if any
significant clearing from prior.

Air distention of small bowel in the upper abdomen may be increased
from recent comparison abdominal radiograph.

## 2021-06-23 MED ORDER — POTASSIUM CHLORIDE 20 MEQ PO PACK
20.0000 meq | PACK | ORAL | Status: AC
  Administered 2021-06-23 (×2): 20 meq
  Filled 2021-06-23 (×2): qty 1

## 2021-06-23 MED ORDER — ROCURONIUM BROMIDE 10 MG/ML (PF) SYRINGE
PREFILLED_SYRINGE | INTRAVENOUS | Status: AC
  Filled 2021-06-23: qty 10

## 2021-06-23 MED ORDER — ETOMIDATE 2 MG/ML IV SOLN
INTRAVENOUS | Status: AC
  Administered 2021-06-23: 20 mg
  Filled 2021-06-23: qty 10

## 2021-06-23 MED ORDER — POTASSIUM CHLORIDE 10 MEQ/100ML IV SOLN
10.0000 meq | INTRAVENOUS | Status: AC
Start: 2021-06-23 — End: 2021-06-23
  Administered 2021-06-23 (×4): 10 meq via INTRAVENOUS
  Filled 2021-06-23 (×4): qty 100

## 2021-06-23 MED ORDER — ROCURONIUM BROMIDE 10 MG/ML (PF) SYRINGE
1.0000 mg/kg | PREFILLED_SYRINGE | Freq: Once | INTRAVENOUS | Status: AC
  Administered 2021-06-23: 50.9 mg via INTRAVENOUS

## 2021-06-23 MED ORDER — MIDAZOLAM HCL 2 MG/2ML IJ SOLN
INTRAMUSCULAR | Status: AC
  Filled 2021-06-23: qty 4

## 2021-06-23 MED ORDER — MIDAZOLAM HCL 2 MG/2ML IJ SOLN
3.0000 mg | Freq: Once | INTRAMUSCULAR | Status: AC
  Administered 2021-06-23: 3 mg via INTRAVENOUS

## 2021-06-23 MED ORDER — CISATRACURIUM BESYLATE 20 MG/10ML IV SOLN: 0.1000 mg/kg | Freq: Once | INTRAVENOUS | Status: DC

## 2021-06-23 NOTE — Progress Notes (Signed)
Pt family requests to be notified of significant changes in patient status.

## 2021-06-23 NOTE — Progress Notes (Signed)
eLink Physician-Brief Progress Note Patient Name: Sonia Howard DOB: 03/04/1981 MRN: [id number]   Date of Service  06/23/2021  HPI/Events of Note  Patient self extubated and has desaturated.  eICU Interventions  PCCM Ground crew requested to come and re-intubate patient, intubation drugs ordered.        Clyda Smyth [name] 06/23/2021, 1:40 AM

## 2021-06-23 NOTE — Progress Notes (Addendum)
Per pt's family, her real last name is [place]. Will need to follow up with pt access for futher changes.

## 2021-06-23 NOTE — Progress Notes (Signed)
Entered patient room to find her tube holder had fallen off her face and her tube was in questionable position. Called RT to room, after assessment- decided that patient's tube had dislodged. Elink notified and ground team dispatched to reintubate patient. Patient now resting comfortably.   Sonia Howard A Kaydyn Chism

## 2021-06-23 NOTE — Progress Notes (Signed)
[facility] ADULT ICU REPLACEMENT PROTOCOL   The patient does apply for the [facility] Adult ICU Electrolyte Replacment Protocol based on the criteria listed below:   1.Exclusion criteria: TCTS patients, ECMO patients and Hypothermia Protocol, and   Dialysis patients 2. Is GFR >/= 30 ml/min? Yes.    Patient's GFR today is > 60 3. Is SCr </= 2? Yes.   Patient's SCr is 0.34 mg/dL 4. Did SCr increase >/= 0.5 in 24 hours? No. 5.Pt's weight >40kg  Yes.   6. Abnormal electrolyte(s):  K 3.0  7. Electrolytes replaced per protocol 8.  Call MD STAT for K+ </= 2.5, Phos </= 1, or Mag </= 1 Physician:  Shawn Stall R Baudelia Schroepfer 06/23/2021 5:31 AM

## 2021-06-23 NOTE — Progress Notes (Signed)
Patient self-extubated, ETT is displaced. Patient saturations are in the 70's. CMM called to re-intubate patient.

## 2021-06-23 NOTE — Procedures (Addendum)
Intubation Procedure Note  Alashia Brownfield  [id number]  10/15/1981  Date:06/23/21  Time:1:59 AM   Provider Performing:Seong-Joo Ardeth Perfect    Procedure: Intubation (31500)  Indication(s) Respiratory Failure  Consent Unable to obtain consent due to emergent nature of procedure.   Anesthesia Etomidate, Versed, and Rocuronium   Time Out Verified patient identification, verified procedure, site/side was marked, verified correct patient position, special equipment/implants available, medications/allergies/relevant history reviewed, required imaging and test results available.   Sterile Technique Usual hand hygeine, masks, and gloves were used   Procedure Description Patient positioned in bed supine.  Sedation given as noted above.  Patient was intubated with endotracheal tube using Glidescope.  View was Grade 1 full glottis .  Number of attempts was 1.  Colorimetric CO2 detector was consistent with tracheal placement.  23 cm at the lip.   Complications/Tolerance None; patient tolerated the procedure well.  Gross aspiration into airway was directly visualized. Chest X-ray is ordered to verify placement.   EBL None   Specimen(s) None   Marcelle Smiling, MD Board Certified by the ABIM, Pulmonary Diseases & Critical Care Medicine

## 2021-06-23 NOTE — Progress Notes (Addendum)
NAME:  Sonia Howard, MRN:  [id number], DOB:  [birth year], LOS: 8 ADMISSION DATE:  06/15/2021, CONSULTATION DATE: 06/15/2021 REFERRING MD:  Maia Plan, MD , CHIEF COMPLAINT: Found unresponsive  History of Present Illness:  Patient is intubated and unresponsive, most of the history is taken from chart review  40 year old female with history of drug overdose and unintentional weight loss who was brought into the emergency department after she was found unresponsive at her friend's house, her last known well was 5 PM yesterday.  Patient's vital signs were stable per EMS report but she was not responsive, her blood sugar was 23, she was given 2 A of D50 with that blood sugar improved to 250, she was given 2 mg of Narcan with no response.  Initially she received bag valve mask ventilation, in the emergency department she was intubated due to her GCS score being 3, in the emergency department patient blood sugar again was noted to be 12, she received D50 x2 and was started on D10. PCCM was consulted for help with evaluation and management  Collateral history per patient's brother: patient has a history of opioid use disorder, specifically fentanyl. Her father passed away in 2023-02-20, and per brother she was found trying to remove some of the fentanyl from her father's port line. She went to [facility] for rehab for 6 weeks in the beginning of April and was put on suboxone. She is legally married to [employer] but have been separated around a year. Per brother, he does not want to have anything to do with her. Patient was living with her friend Mardelle Matte, and Andy's mom who also have a history of IVDU. Patient was found unresponsive by Andy's mom who called EMS.   Pertinent  Medical History  Unintentional weight loss, drug overdose, heroin and opioid use, chronic migraine, bariatric surgery   Significant Hospital Events: Including procedures, antibiotic start and stop dates in addition to  other pertinent events   7/6 admitted and intubated 7/7 ceftriaxone started 7/10 remains intubated, MRI, EEG neg, slowly improving neuro exam, off sedation 7/12 ceftriaxone finished 7/14 self-extubated (ETT anchor fell?) and re-intubated  Interim History / Subjective:   Remains critically ill and not responsive to commands. Intubated on life support. Self-extubated 7/14 possibly d/t ETT anchor falling. Potassium replenished 7/14. Startles to voice, seems to possibly attend for a few seconds. Grimaces and withdraws to pain. More spontaneous movements starting 7/14. Moves around in bed. Opens eyes to verbal stimulation. Upgoing babinskis. Pitting edema noted in upper extremities.  Objective   Blood pressure 103/76, pulse 96, temperature 98.5 F (36.9 C), temperature source Axillary, resp. rate 16, height 5\' 8"  (1.727 m), weight 44.9 kg, SpO2 95 %.    Vent Mode: CPAP;PSV FiO2 (%):  [40 %-50 %] 40 % Set Rate:  [18 bmp] 18 bmp Vt Set:  [510 mL] 510 mL PEEP:  [5 cmH20-8 cmH20] 5 cmH20 Pressure Support:  [5 cmH20-10 cmH20] 5 cmH20 Plateau Pressure:  [15 cmH20] 15 cmH20   Intake/Output Summary (Last 24 hours) at 06/23/2021 1018 Last data filed at 06/23/2021 0700 Gross per 24 hour  Intake 967.16 ml  Output 3330 ml  Net -2362.84 ml    Filed Weights   06/21/21 0321 06/22/21 0500 06/23/21 0414  Weight: 52.4 kg 50.9 kg 44.9 kg    Examination: General appearance: 40 y.o., female intubated on life support, critically ill  HENT: NCAT, ett in place, pupils dilated to 7 mm, reactive bilaterally. Neck: trachea  midline, CVL in place  Lungs: BL vented breaths  CV: RRR, s1 s2 Abdomen: soft, nt nd  Extremities: 2+ pitting edema in upper extremities   Skin: no rash  Neuro: startles to voice, does not follow commands, seems to attend for a few seconds, off sedation, moving feet to stimulation on plantar aspect, concentric pupillary reflex intact, corneal reflex intact, cough reflex intact, opens  eyes to sternal rub, withdraw to pain, upgoing babinski bilaterally, brisk patellar reflex bilaterally.   Resolved Hospital Problem list   -Hypoglycemia -Leukopenia -Shock (possible stress cardiomyopathy)  Assessment & Plan:  Labs Pending: -Vit C (7/7) -Proinsulin/Insulin ratio (7/7) -RCX (7/14)  Coma, ?prolonged down time, possible anoxic brain injury, presumed drug overdose- exam concerning for profound neurological injury.  Found hypoglycemic. Initial CT head neg. MRI unrevealing. EEG no seizure. Recent MRI, CTA head ok. Patient status improving gradually. Neurology discussed with family regarding prognosis of long recovery (months) -neurology following as needed -hold continuous sedation -avoid benzos -monitor electrolytes and replenish as needed  Hypoglycemia- Glucose 12 in ED and received D50x2 and D10 drip. Glucose stable now on TFs.  C-peptide low. -Monitor blood glucose daily  Hypervolemia- 2+ pitting edema. Remains hypervolemic (net +3.9 L since admission) -Lasix 20 mg IV x1 7/14 -bethanechol (Urecholine) 10 mg TID (1/3 days)  Aspiration pneumonia, bilateral lower lungs- bilateral opacities on 7/7 CXR.  -finished Ceftriaxone 7/12  Acute hypoxemic respiratory failure requiring intubation and mechanical ventilation- vent originally set to [facility]. Starting to wean with PS/CPAP.   DVT prophylaxis: -enoxaparin 40 mg qd  GI prophylaxis: -Protonix 40mg  qd  Frequent stool: Likely combo of  opioid withdrawal or tube feeding diet. -Fiber packet BID -consider different feeding supplement  Diet: -Cortrak in place -feeding supplement Prosource TF 45 mL qd -VITAL 1.5 CAL continuous feeding supplement -multivitamin with minerals 1 tablet BID -hold 7/13 calcium carbonate 250 mg TID -thiamine 100 mg qd  Best Practice (right click and "Reselect all SmartList Selections" daily)   Diet/type: TF DVT prophylaxis: heparin GI prophylaxis: PPI Lines: foley, central line Code  Status:  full code Last date of multidisciplinary goals of care discussion- 7/7, get more info  CRITICAL CARE Performed by: 9/7 Dakia Schifano  Total critical care time: 31 minutes  Critical care time was exclusive of separately billable procedures and treating other patients.  Critical care was necessary to treat or prevent imminent or life-threatening deterioration.  Critical care was time spent personally by me on the following activities: development of treatment plan with patient and/or surrogate as well as nursing, discussions with consultants, evaluation of patient's response to treatment, examination of patient, obtaining history from patient or surrogate, ordering and performing treatments and interventions, ordering and review of laboratory studies, ordering and review of radiographic studies, pulse oximetry and re-evaluation of patient's condition.   Lesia Sago, MD See Karren Burly for contact info

## 2021-06-24 DIAGNOSIS — J9601 Acute respiratory failure with hypoxia: Secondary | ICD-10-CM

## 2021-06-24 LAB — CBC
HCT: 30.5 % — ABNORMAL LOW (ref 36.0–46.0)
Hemoglobin: 9.5 g/dL — ABNORMAL LOW (ref 12.0–15.0)
MCH: 27.7 pg (ref 26.0–34.0)
MCHC: 31.1 g/dL (ref 30.0–36.0)
MCV: 88.9 fL (ref 80.0–100.0)
Platelets: 332 10*3/uL (ref 150–400)
RBC: 3.43 MIL/uL — ABNORMAL LOW (ref 3.87–5.11)
RDW: 20.3 % — ABNORMAL HIGH (ref 11.5–15.5)
WBC: 12.6 10*3/uL — ABNORMAL HIGH (ref 4.0–10.5)
nRBC: 0 % (ref 0.0–0.2)

## 2021-06-24 LAB — COMPREHENSIVE METABOLIC PANEL
ALT: 57 U/L — ABNORMAL HIGH (ref 0–44)
AST: 52 U/L — ABNORMAL HIGH (ref 15–41)
Albumin: 2.2 g/dL — ABNORMAL LOW (ref 3.5–5.0)
Alkaline Phosphatase: 70 U/L (ref 38–126)
Anion gap: 7 (ref 5–15)
BUN: 11 mg/dL (ref 6–20)
CO2: 32 mmol/L (ref 22–32)
Calcium: 8.5 mg/dL — ABNORMAL LOW (ref 8.9–10.3)
Chloride: 104 mmol/L (ref 98–111)
Creatinine, Ser: 0.41 mg/dL — ABNORMAL LOW (ref 0.44–1.00)
GFR, Estimated: >60 mL/min (ref >60)
Glucose, Bld: 130 mg/dL — ABNORMAL HIGH (ref 70–99)
Potassium: 3.5 mmol/L (ref 3.5–5.1)
Sodium: 143 mmol/L (ref 135–145)
Total Bilirubin: 0.3 mg/dL (ref 0.3–1.2)
Total Protein: 6.6 g/dL (ref 6.5–8.1)

## 2021-06-24 MED ORDER — VANCOMYCIN HCL IN DEXTROSE 1-5 GM/200ML-% IV SOLN
1000.0000 mg | INTRAVENOUS | Status: DC
  Administered 2021-06-25: 1000 mg via INTRAVENOUS
  Filled 2021-06-24: qty 200

## 2021-06-24 MED ORDER — POTASSIUM CHLORIDE 20 MEQ PO PACK
40.0000 meq | PACK | Freq: Once | ORAL | Status: AC
  Administered 2021-06-24: 40 meq
  Filled 2021-06-24: qty 2

## 2021-06-24 MED ORDER — VANCOMYCIN HCL IN DEXTROSE 1-5 GM/200ML-% IV SOLN
1000.0000 mg | Freq: Once | INTRAVENOUS | Status: AC
  Administered 2021-06-24: 1000 mg via INTRAVENOUS
  Filled 2021-06-24: qty 200

## 2021-06-24 MED ORDER — ASCORBIC ACID 500 MG PO TABS
500.0000 mg | ORAL_TABLET | Freq: Two times a day (BID) | ORAL | Status: DC
  Administered 2021-06-24 – 2021-07-01 (×15): 500 mg
  Filled 2021-06-24 (×15): qty 1

## 2021-06-24 NOTE — TOC Progression Note (Signed)
Transition of Care [facility]) - Progression Note    Patient Details  Name: Sonia Howard MRN: [id number] Date of Birth: 06-Jul-1981  [facility] CM/SW Contact  Lockie Pares, RN Phone Number: 06/24/2021, 11:09 AM  Clinical Narrative:    Patient remains mostly unresponsive startles to noise Neuro involvement, hypoxic injury.NOK is fragmented has husband who legally would be Next of kin, do not know if they are legally separated, and has brother.  Awaiting palliative consult for Goals of care. Had to be reintubated due to unsecured airway.  She would not be eligible for LTAC, due to insurance. For skilled care she would need trach, PEG. , if family chooses to go that route. Still would be difficult to place given limited insurance.    Barriers to Discharge: Continued Medical Work up  Expected Discharge Plan and Services   In-house Referral: Clinical Social Work, Hospice / Palliative Care Discharge Planning Services: CM Consult   Living arrangements for the past 2 months: Single Family Home                                       Social Determinants of Health (SDOH) Interventions    Readmission Risk Interventions No flowsheet data found.

## 2021-06-24 NOTE — Progress Notes (Signed)
Pharmacy Antibiotic Note  Sonia Howard is a 40 y.o. female admitted on 06/15/2021 and now with concern for PNA with Staph aureus growing on 7/14. Pharmacy has been consulted for Vancomycin dosing while awaiting sensitivities.   SCr 0.41 << 0.34 - will monitor closely given low baseline (0.3's).   Plan: - Start Vancomycin 1g IV every 24 hours - Will continue to follow renal function, culture results, LOT, and antibiotic de-escalation plans   Height: 5\' 8"  (172.7 cm) Weight: 45.7 kg (100 lb 12 oz) IBW/kg (Calculated) : 63.9  Temp (24hrs), Avg:99.1 F (37.3 C), Min:98.2 F (36.8 C), Max:100 F (37.8 C)  Recent Labs  Lab 06/20/21 0344 06/21/21 0315 06/22/21 0602 06/23/21 0239 06/24/21 0156  WBC 5.6 6.8 8.3 10.9* 12.6*  CREATININE 0.33* 0.33* 0.36* 0.34* 0.41*    Estimated Creatinine Clearance: 66.8 mL/min (A) (by C-G formula based on SCr of 0.41 mg/dL (L)).    Not on File  Zosyn 7/6 >> 7/7 Vanc 7/7 >> 7/8; restart 7/15 CTX 7/7 >>  7/12  7/6 MRSA PCR - negative 7/6 Strep pneumo Ur Ag - negative 7/7 BCx - NGTD 7/14 RCx (TA) >> few Staph aureus  Thank you for allowing pharmacy to be a part of this patient's care.  8/14, PharmD, BCPS Clinical Pharmacist Clinical phone for 06/24/2021: 06/26/2021 06/24/2021 10:15 AM   **Pharmacist phone directory can now be found on [email or web address] ([email or web address]).  Listed under [facility].

## 2021-06-24 NOTE — Progress Notes (Addendum)
NAME:  Sonia Howard, MRN:  [id number], DOB:  1981/06/07, LOS: 9 ADMISSION DATE:  06/15/2021, CONSULTATION DATE: 06/15/2021 REFERRING MD:  Maia Plan, MD , CHIEF COMPLAINT: Found unresponsive  History of Present Illness:  Patient is intubated and unresponsive, most of the history is taken from chart review  40 year old female with history of drug overdose and unintentional weight loss who was brought into the emergency department after she was found unresponsive at her friend's house, her last known well was 5 PM yesterday.  Patient's vital signs were stable per EMS report but she was not responsive, her blood sugar was 23, she was given 2 A of D50 with that blood sugar improved to 250, she was given 2 mg of Narcan with no response.  Initially she received bag valve mask ventilation, in the emergency department she was intubated due to her GCS score being 3, in the emergency department patient blood sugar again was noted to be 12, she received D50 x2 and was started on D10. PCCM was consulted for help with evaluation and management  Collateral history per patient's brother: patient has a history of opioid use disorder, specifically fentanyl. Her father passed away in 2023/02/28, and per brother she was found trying to remove some of the fentanyl from her father's port line. She went to [facility] for rehab for 6 weeks in the beginning of April and was put on suboxone. She is legally married to [employer] but have been separated around a year. Per brother, he does not want to have anything to do with her. Patient was living with her friend Mardelle Matte, and Andy's mom who also have a history of IVDU. Patient was found unresponsive by Andy's mom who called EMS.   Pertinent  Medical History  Unintentional weight loss, drug overdose, heroin and opioid use, chronic migraine, bariatric surgery   Significant Hospital Events: Including procedures, antibiotic start and stop dates in addition to  other pertinent events   7/6 admitted and intubated 7/7 ceftriaxone started 7/10 remains intubated, MRI, EEG neg, slowly improving neuro exam, off sedation 7/12 ceftriaxone finished 7/14 self-extubated (ETT anchor fell?) and re-intubated  Interim History / Subjective:   No acute events overnight. Remains critically ill and not responsive to commands. Intubated on life support. Potassium replenished 7/15. More spontaneous movements starting 7/14. Moves around in bed. Restraints applied 7/15. Opens eyes to verbal stimulation. Potassium repletion 7/15.  Objective   Blood pressure 109/71, pulse 83, temperature 98.4 F (36.9 C), temperature source Axillary, resp. rate 18, height 5\' 8"  (1.727 m), weight 45.7 kg, SpO2 94 %.    Vent Mode: CPAP FiO2 (%):  [40 %] 40 % Set Rate:  [18 bmp] 18 bmp Vt Set:  [510 mL] 510 mL PEEP:  [5 cmH20-8 cmH20] 5 cmH20 Pressure Support:  [5 cmH20-8 cmH20] 8 cmH20   Intake/Output Summary (Last 24 hours) at 06/24/2021 1017 Last data filed at 06/24/2021 0600 Gross per 24 hour  Intake 1100 ml  Output 400 ml  Net 700 ml    Filed Weights   06/22/21 0500 06/23/21 0414 06/24/21 0321  Weight: 50.9 kg 44.9 kg 45.7 kg    Examination: General appearance: 40 y.o., female intubated on life support, critically ill  HENT: NCAT, ett in place, pupils dilated to 7 mm, reactive bilaterally. Neck: trachea midline, CVL in place  Lungs: BL vented breaths  CV: RRR, s1 s2 Abdomen: soft, nt nd  Extremities: unable to observe upper extremities d/t mittens Skin: no  rash  Neuro: startles to voice, does not follow commands, seems to attend for a few seconds, off sedation, moving feet to stimulation on plantar aspect, concentric pupillary reflex intact, grimaces and withdraws to pain, upgoing babinski bilaterally, brisk patellar reflex bilaterally.  Resolved Hospital Problem list   -Hypoglycemia -Leukopenia -Shock (possible stress cardiomyopathy) -Hypervolemia  Assessment &  Plan:  Labs Pending: -Proinsulin/Insulin ratio (7/7) -RCX (7/14) -ionized calcium (7/15)  Coma/Encephalopathy, possible anoxic brain injury, presumed drug overdose- exam concerning for profound neurological injury. Urine tox screen negative. Found hypoglycemic (Glu as low as 12). Initial CT head neg. MRI unrevealing. EEG no seizure. Recent MRI, CTA head ok. Patient status improving gradually. Neurology discussed with family regarding prognosis of long recovery (months). -neurology following as needed -hold continuous sedation -avoid benzos -monitor electrolytes and replenish as needed -restraints on 7/15  Aspiration pneumonia, bilateral lower lungs- bilateral opacities on 7/7 CXR. Tracheal aspirate 7/14 grew few staph aureus. CXR 7/14 shows persistent bilateral opacities, but appears mildly improved compared to 7/7. New leukocytosis WBC 12.6 (7/15). -ceftriaxone 7/7-7/12. -start vancomycin 1000 mg IV (7/15)  Leukocytosis- WBC 12.6 (7/15). Concerning for respiratory related infection as above or UTI. Foley cath from 7/6-7/14. -UA to r/o UTI  Acute hypoxemic respiratory failure requiring intubation and mechanical ventilation- vent originally set to [facility]. Starting to wean with PS/CPAP. Patient tolerating well at FiO2 40% and PEEP 5. -daily WUA/SBT -pulmonary hygiene  DVT prophylaxis: -enoxaparin 40 mg qd  GI prophylaxis: -Protonix 40mg  qd  Frequent stool: Up to 10x/day. Likely combo of  opioid withdrawal or tube feeding diet.  -Fiber packet qd -stool x1 7/14  Scurvy: Severely malnourished and history of gastric sleeve surgery. VitC <0.1. -ascorbic acid (Vit C) 500 mg BID  Diet: -Cortrak in place -feeding supplement Prosource TF 45 mL qd -VITAL 1.5 CAL continuous feeding supplement -multivitamin with minerals 1 tablet BID -hold 7/13 calcium carbonate 250 mg TID -thiamine 100 mg qd  Best Practice (right click and "Reselect all SmartList Selections" daily)   Diet/type:  TF DVT prophylaxis: heparin GI prophylaxis: PPI Lines: foley, central line Code Status:  full code Last date of multidisciplinary goals of care discussion- 7/7, get more info  CRITICAL CARE Performed by: 9/7  Total critical care time: 31 minutes  Critical care time was exclusive of separately billable procedures and treating other patients.  Critical care was necessary to treat or prevent imminent or life-threatening deterioration.  Critical care was time spent personally by me on the following activities: development of treatment plan with patient and/or surrogate as well as nursing, discussions with consultants, evaluation of patient's response to treatment, examination of patient, obtaining history from patient or surrogate, ordering and performing treatments and interventions, ordering and review of laboratory studies, ordering and review of radiographic studies, pulse oximetry and re-evaluation of patient's condition.   Corinna Lines, Medical Student See Amion for contact info

## 2021-06-24 NOTE — Progress Notes (Signed)
[facility] ADULT ICU REPLACEMENT PROTOCOL   The patient does apply for the [facility] Adult ICU Electrolyte Replacment Protocol based on the criteria listed below:   1.Exclusion criteria: TCTS patients, ECMO patients and Hypothermia Protocol, and   Dialysis patients 2. Is GFR >/= 30 ml/min? Yes.    Patient's GFR today is >60 3. Is SCr </= 2? Yes.   Patient's SCr is 0.41 mg/dL 4. Did SCr increase >/= 0.5 in 24 hours? No. 5.Pt's weight >40kg  Yes.   6. Abnormal electrolyte(s): k+ 3.5  7. Electrolytes replaced per protocol 8.  Call MD STAT for K+ </= 2.5, Phos </= 1, or Mag </= 1 Physician:  n/a   Sonia Howard 06/24/2021 4:58 AM

## 2021-06-25 LAB — BASIC METABOLIC PANEL
Anion gap: 8 (ref 5–15)
BUN: 13 mg/dL (ref 6–20)
CO2: 29 mmol/L (ref 22–32)
Calcium: 8.4 mg/dL — ABNORMAL LOW (ref 8.9–10.3)
Chloride: 104 mmol/L (ref 98–111)
Creatinine, Ser: 0.36 mg/dL — ABNORMAL LOW (ref 0.44–1.00)
GFR, Estimated: >60 mL/min (ref >60)
Glucose, Bld: 112 mg/dL — ABNORMAL HIGH (ref 70–99)
Potassium: 3.6 mmol/L (ref 3.5–5.1)
Sodium: 141 mmol/L (ref 135–145)

## 2021-06-25 LAB — CULTURE, RESPIRATORY W GRAM STAIN

## 2021-06-25 LAB — CBC
HCT: 31.4 % — ABNORMAL LOW (ref 36.0–46.0)
Hemoglobin: 10 g/dL — ABNORMAL LOW (ref 12.0–15.0)
MCH: 28.4 pg (ref 26.0–34.0)
MCHC: 31.8 g/dL (ref 30.0–36.0)
MCV: 89.2 fL (ref 80.0–100.0)
Platelets: 378 10*3/uL (ref 150–400)
RBC: 3.52 MIL/uL — ABNORMAL LOW (ref 3.87–5.11)
RDW: 20 % — ABNORMAL HIGH (ref 11.5–15.5)
WBC: 14 10*3/uL — ABNORMAL HIGH (ref 4.0–10.5)
nRBC: 0 % (ref 0.0–0.2)

## 2021-06-25 MED ORDER — FENTANYL CITRATE (PF) 100 MCG/2ML IJ SOLN
25.0000 ug | INTRAMUSCULAR | Status: DC | PRN
Start: 2021-06-25 — End: 2021-07-03

## 2021-06-25 MED ORDER — POTASSIUM CHLORIDE 20 MEQ PO PACK
40.0000 meq | PACK | Freq: Once | ORAL | Status: AC
  Administered 2021-06-25: 40 meq
  Filled 2021-06-25: qty 2

## 2021-06-25 MED ORDER — NUTRISOURCE FIBER PO PACK
1.0000 | PACK | Freq: Every day | ORAL | Status: DC
  Administered 2021-06-26 – 2021-06-30 (×4): 1
  Filled 2021-06-25 (×5): qty 1

## 2021-06-25 MED ORDER — CEFAZOLIN SODIUM-DEXTROSE 1-4 GM/50ML-% IV SOLN
1.0000 g | Freq: Three times a day (TID) | INTRAVENOUS | Status: AC
  Administered 2021-06-25 – 2021-06-28 (×11): 1 g via INTRAVENOUS
  Filled 2021-06-25 (×11): qty 50

## 2021-06-25 NOTE — Progress Notes (Signed)
eLink Physician-Brief Progress Note Patient Name: Harlowe Dowler DOB: 1981-03-12 MRN: [id number]   Date of Service  06/25/2021  HPI/Events of Note  Patient needs her soft wrist restraints order renewed due to her high risk for self extubation.  eICU Interventions  Bilateral wrist restraints order renewed.        Thomasene Lot Shakeya Kerkman 06/25/2021, 7:56 PM

## 2021-06-25 NOTE — Progress Notes (Signed)
[facility] ADULT ICU REPLACEMENT PROTOCOL   The patient does apply for the [facility] Adult ICU Electrolyte Replacment Protocol based on the criteria listed below:   1.Exclusion criteria: TCTS patients, ECMO patients and Hypothermia Protocol, and   Dialysis patients 2. Is GFR >/= 30 ml/min? Yes.    Patient's GFR today is >60   3. Is SCr </= 2? Yes.   Patient's SCr is 0.36 mg/dL 4. Did SCr increase >/= 0.5 in 24 hours? No. 5.Pt's weight >40kg  Yes.   6. Abnormal electrolyte(s):  K 3.6  7. Electrolytes replaced per protocol 8.  Call MD STAT for K+ </= 2.5, Phos </= 1, or Mag </= 1 Physician:  Shawn Stall R Malvika Tung 06/25/2021 5:56 AM

## 2021-06-25 NOTE — Progress Notes (Signed)
NAME:  Sonia Howard, MRN:  [id number], DOB:  [birth year], LOS: 10 ADMISSION DATE:  06/15/2021, CONSULTATION DATE: 06/15/2021 REFERRING MD:  Maia Plan, MD , CHIEF COMPLAINT: Found unresponsive  History of Present Illness:  Patient is intubated and unresponsive, most of the history is taken from chart review  40 year old female with history of drug overdose and unintentional weight loss who was brought into the emergency department after she was found unresponsive at her friend's house, her last known well was 5 PM yesterday.  Patient's vital signs were stable per EMS report but she was not responsive, her blood sugar was 23, she was given 2 A of D50 with that blood sugar improved to 250, she was given 2 mg of Narcan with no response.  Initially she received bag valve mask ventilation, in the emergency department she was intubated due to her GCS score being 3, in the emergency department patient blood sugar again was noted to be 12, she received D50 x2 and was started on D10. PCCM was consulted for help with evaluation and management  Collateral history per patient's brother: patient has a history of opioid use disorder, specifically fentanyl. Her father passed away in 10-Mar-2023, and per brother she was found trying to remove some of the fentanyl from her father's port line. She went to [facility] for rehab for 6 weeks in the beginning of April and was put on suboxone. She is legally married to [employer] but have been separated around a year. Per brother, he does not want to have anything to do with her. Patient was living with her friend Mardelle Matte, and Andy's mom who also have a history of IVDU. Patient was found unresponsive by Andy's mom who called EMS.   Pertinent  Medical History  Unintentional weight loss, drug overdose, heroin and opioid use, chronic migraine, bariatric surgery   Significant Hospital Events: Including procedures, antibiotic start and stop dates in addition to  other pertinent events   7/6 admitted and intubated 7/7 ceftriaxone started 7/10 remains intubated, MRI, EEG neg, slowly improving neuro exam, off sedation 7/12 ceftriaxone finished 7/14 self-extubated (ETT anchor fell?) and re-intubated Resp cx 7/14 >> MSSA Vanco x 1 day 7/15; changed to cefazolin 7/16  Interim History / Subjective:   WBC 12.6 > 14.0 I/O+ 2.8 L total 0.50, PEEP 5 No continuous sedation  Objective   Blood pressure 111/74, pulse 90, temperature 98.4 F (36.9 C), temperature source Oral, resp. rate 19, height 5\' 8"  (1.727 m), weight 44.5 kg, SpO2 94 %.    Vent Mode: PRVC FiO2 (%):  [40 %-50 %] 50 % Set Rate:  [18 bmp] 18 bmp Vt Set:  [510 mL] 510 mL PEEP:  [5 cmH20] 5 cmH20 Pressure Support:  [5 cmH20-8 cmH20] 5 cmH20 Plateau Pressure:  [15 cmH20] 15 cmH20   Intake/Output Summary (Last 24 hours) at 06/25/2021 0728 Last data filed at 06/25/2021 0600 Gross per 24 hour  Intake 1155 ml  Output 850 ml  Net 305 ml   Filed Weights   06/23/21 0414 06/24/21 0321 06/25/21 0115  Weight: 44.9 kg 45.7 kg 44.5 kg    Examination: General appearance: Thin critically ill woman, appears older than her age HENT: ET tube in place, pupils equal and reactive, oropharynx clear Neck: CVC in place, Lungs: Clear bilaterally, no crackles or wheezes CV: Regular, borderline tachycardic, no murmur Abdomen: Nondistended, nontender, positive bowel sounds Extremities: No edema Skin: No rash Neuro: Eyes are open spontaneously and to voice, may  be intermittently tracking.  Moving all extremities somewhat randomly and squirming in the bed but did not follow any commands.  Chest x-ray 7/14 reviewed, shows bilateral alveolar infiltrates.  ET tube in good position  Resolved Hospital Problem list   -Hypoglycemia -Leukopenia -Shock (possible stress cardiomyopathy) -Hypervolemia  Assessment & Plan:    Coma/Encephalopathy, possible anoxic or hypoglycemic brain injury, presumed drug  overdose- exam concerning for profound neurological injury. Urine tox screen negative. Found hypoglycemic (Glu as low as 12). Initial CT head neg. MRI unrevealing. EEG no seizure. Recent MRI, CTA head ok. May be improving gradually. Neurology discussed with family regarding prognosis of long recovery (months). -appreciate Neurology assistance -Minimizing sedation, holding any continuous sedation -Correct metabolic disarray, hypoglycemia -Safety restraints in place  Aspiration pneumonia, bilateral lower lungs- bilateral opacities on 7/7 CXR. Tracheal aspirate 7/14 grew few staph aureus. CXR 7/14 shows persistent bilateral opacities, but appears mildly improved compared to 7/7. New leukocytosis WBC 12.6 (7/15). -Completed course of ceftriaxone on 7/12 -Started vancomycin on 7/15 given staff aureus > MSSA.  Plan changed to cefazolin to complete 4 more days (5 days antibiotics total).  Acute hypoxemic respiratory failure requiring intubation and mechanical ventilation- vent originally set to [facility]. Starting to wean with PS/CPAP. Patient tolerating well at FiO2 40% and PEEP 5. -Tolerating pressure port ventilation but does not appear to had mental status for extubation.  It may be possible to consider extubation even if she cannot follow commands if she has adequate cough and gag.  Would be reasonable to consider this before committing her to tracheostomy. -Continue aggressive pulmonary hygiene  DVT prophylaxis: -Continue enoxaparin 40 mg qd  GI prophylaxis: -Continue Protonix 40mg  qd  Frequent stool: Up to 10x/day. Likely combo of  opioid withdrawal and tube feeding diet.  -Continue fiber packet qd  Scurvy: Severely malnourished and history of gastric sleeve surgery. VitC <0.1. -continue ascorbic acid (Vit C) 500 mg BID  Protein calorie Malnutrition, severe: -Tube feeding via core track, Prosource supplementation -Thiamine, MVI -feeding supplement Prosource TF 45 mL qd -VITAL 1.5 CAL  continuous feeding supplement -multivitamin with minerals 1 tablet BID -hold 7/13 calcium carbonate 250 mg TID -thiamine 100 mg qd  Best Practice (right click and "Reselect all SmartList Selections" daily)   Diet/type: TF DVT prophylaxis: heparin GI prophylaxis: PPI Lines: foley, central line Code Status:  full code Last date of multidisciplinary goals of care discussion- 7/7  CRITICAL CARE Performed by: 9/7  Total critical care time: 32 minutes  Critical care time was exclusive of separately billable procedures and treating other patients.  Critical care was necessary to treat or prevent imminent or life-threatening deterioration.  Critical care was time spent personally by me on the following activities: development of treatment plan with patient and/or surrogate as well as nursing, discussions with consultants, evaluation of patient's response to treatment, examination of patient, obtaining history from patient or surrogate, ordering and performing treatments and interventions, ordering and review of laboratory studies, ordering and review of radiographic studies, pulse oximetry and re-evaluation of patient's condition.   Leslye Peer, MD, PhD 06/25/2021, 12:39 PM [facility] [phone] or if no answer before 7:00PM call [phone] For any issues after 7:00PM please call eLink [phone]

## 2021-06-26 LAB — CBC
HCT: 32.6 % — ABNORMAL LOW (ref 36.0–46.0)
Hemoglobin: 10.2 g/dL — ABNORMAL LOW (ref 12.0–15.0)
MCH: 28.3 pg (ref 26.0–34.0)
MCHC: 31.3 g/dL (ref 30.0–36.0)
MCV: 90.3 fL (ref 80.0–100.0)
Platelets: 448 10*3/uL — ABNORMAL HIGH (ref 150–400)
RBC: 3.61 MIL/uL — ABNORMAL LOW (ref 3.87–5.11)
RDW: 19.9 % — ABNORMAL HIGH (ref 11.5–15.5)
WBC: 12 10*3/uL — ABNORMAL HIGH (ref 4.0–10.5)
nRBC: 0 % (ref 0.0–0.2)

## 2021-06-26 LAB — BASIC METABOLIC PANEL
Anion gap: 6 (ref 5–15)
BUN: 14 mg/dL (ref 6–20)
CO2: 33 mmol/L — ABNORMAL HIGH (ref 22–32)
Calcium: 8.5 mg/dL — ABNORMAL LOW (ref 8.9–10.3)
Chloride: 101 mmol/L (ref 98–111)
Creatinine, Ser: 0.41 mg/dL — ABNORMAL LOW (ref 0.44–1.00)
GFR, Estimated: >60 mL/min (ref >60)
Glucose, Bld: 127 mg/dL — ABNORMAL HIGH (ref 70–99)
Potassium: 3.4 mmol/L — ABNORMAL LOW (ref 3.5–5.1)
Sodium: 140 mmol/L (ref 135–145)

## 2021-06-26 LAB — GLUCOSE, CAPILLARY
Glucose-Capillary: 120 mg/dL — ABNORMAL HIGH (ref 70–99)
Glucose-Capillary: 121 mg/dL — ABNORMAL HIGH (ref 70–99)

## 2021-06-26 LAB — CALCIUM, IONIZED: Calcium, Ionized, Serum: 5 mg/dL (ref 4.5–5.6)

## 2021-06-26 MED ORDER — POTASSIUM CHLORIDE 20 MEQ PO PACK
40.0000 meq | PACK | Freq: Once | ORAL | Status: AC
  Administered 2021-06-26: 40 meq
  Filled 2021-06-26: qty 2

## 2021-06-26 MED ORDER — ENOXAPARIN SODIUM 30 MG/0.3ML IJ SOSY
30.0000 mg | PREFILLED_SYRINGE | INTRAMUSCULAR | Status: DC
  Administered 2021-06-26 – 2021-06-30 (×5): 30 mg via SUBCUTANEOUS
  Filled 2021-06-26 (×6): qty 0.3

## 2021-06-26 NOTE — Progress Notes (Signed)
Pt placed on PS/CPAP 5/5. Pt is tolerating well at this time. RT will continue to monitor.

## 2021-06-26 NOTE — Progress Notes (Signed)
NAME:  Sonia Howard, MRN:  [id number], DOB:  02-22-1981, LOS: 11 ADMISSION DATE:  06/15/2021, CONSULTATION DATE: 06/15/2021 REFERRING MD:  Maia Plan, MD , CHIEF COMPLAINT: Found unresponsive  History of Present Illness:  Patient is intubated and unresponsive, most of the history is taken from chart review  40 year old female with history of drug overdose and unintentional weight loss who was brought into the emergency department after she was found unresponsive at her friend's house, her last known well was 5 PM yesterday.  Patient's vital signs were stable per EMS report but she was not responsive, her blood sugar was 23, she was given 2 A of D50 with that blood sugar improved to 250, she was given 2 mg of Narcan with no response.  Initially she received bag valve mask ventilation, in the emergency department she was intubated due to her GCS score being 3, in the emergency department patient blood sugar again was noted to be 12, she received D50 x2 and was started on D10. PCCM was consulted for help with evaluation and management  Collateral history per patient's brother: patient has a history of opioid use disorder, specifically fentanyl. Her father passed away in February 27, 2023, and per brother she was found trying to remove some of the fentanyl from her father's port line. She went to [facility] for rehab for 6 weeks in the beginning of April and was put on suboxone.  She is legally married to [employer] but have been separated around a year. Per brother, he does not want to have anything to do with her. Patient was living with her friend Mardelle Matte, and Andy's mom who also have a history of IVDU. Patient was found unresponsive by Andy's mom who called EMS.   Pertinent  Medical History  Unintentional weight loss, drug overdose, heroin and opioid use, chronic migraine, bariatric surgery   Significant Hospital Events: Including procedures, antibiotic start and stop dates in addition to  other pertinent events   7/6 admitted and intubated 7/7 ceftriaxone started 7/10 remains intubated, MRI, EEG neg, slowly improving neuro exam, off sedation 7/12 ceftriaxone finished 7/14 self-extubated (ETT anchor fell?) and re-intubated Resp cx 7/14 >> MSSA Vanco x 1 day 7/15; changed to cefazolin 7/16  Interim History / Subjective:  Vancomycin changed to cefazolin 7/16 No new events reported overnight Not on any sedating infusions 0.50, PEEP 5 I/O+ 3.2 L total Mild hypokalemia  Objective   Blood pressure 106/64, pulse 81, temperature 98.1 F (36.7 C), temperature source Oral, resp. rate 17, height 5\' 8"  (1.727 m), weight 44.3 kg, SpO2 96 %.    Vent Mode: PRVC FiO2 (%):  [40 %-50 %] 50 % Set Rate:  [18 bmp] 18 bmp Vt Set:  [510 mL] 510 mL PEEP:  [5 cmH20] 5 cmH20 Pressure Support:  [5 cmH20] 5 cmH20 Plateau Pressure:  [12 cmH20] 12 cmH20   Intake/Output Summary (Last 24 hours) at 06/26/2021 0755 Last data filed at 06/26/2021 0600 Gross per 24 hour  Intake 1536.65 ml  Output 1200 ml  Net 336.65 ml   Filed Weights   06/24/21 0321 06/25/21 0115 06/26/21 0303  Weight: 45.7 kg 44.5 kg 44.3 kg    Examination: General appearance: Thin critically ill woman, appears older than her age HENT: ET tube in place, pupils equal and reactive, oropharynx clear Neck: CVC in place, Lungs: Clear bilaterally, no crackles or wheezes CV: Regular, borderline tachycardic, no murmur Abdomen: Nondistended, nontender, positive bowel sounds Extremities: No edema Skin: No rash  Neuro: Eyes are open spontaneously and to voice, may be intermittently tracking.  Moving all extremities somewhat randomly and squirming in the bed but did not follow any commands.   Resolved Hospital Problem list   -Hypoglycemia -Leukopenia -Shock (possible stress cardiomyopathy) -Hypervolemia  Assessment & Plan:    Coma/Encephalopathy, possible anoxic or hypoglycemic brain injury, presumed drug overdose- exam  concerning for profound neurological injury. Urine tox screen negative. Found hypoglycemic (Glu as low as 12). Initial CT head neg. MRI unrevealing. EEG no seizure. Recent MRI, CTA head ok. May be improving gradually. Neurology discussed with family regarding prognosis of long recovery (months). -Continue with sedation is on hold -Appreciate neurology evaluation -Correct hypoglycemia (improved), metabolic disarray -Course has been consistent with a significant neurological injury, presumed due to hypoglycemia.  Prognosis for neuro recovery guarded -Safety restraints in place  Aspiration pneumonia, bilateral lower lungs- bilateral opacities on 7/7 CXR. Tracheal aspirate 7/14 grew few staph aureus. CXR 7/14 shows persistent bilateral opacities, but appears mildly improved compared to 7/7. New leukocytosis WBC 12.6 (7/15). -Completed initial course of ceftriaxone on 7/12 -Currently on cefazolin for MSSA on respiratory culture 7/14, plan to continue through 7/19  Acute hypoxemic respiratory failure requiring intubation and mechanical ventilation- vent originally set to [facility]. Starting to wean with PS/CPAP. Patient tolerating well at FiO2 40% and PEEP 5. -Intermittently tolerates some pressure support ventilation.  Does not follow commands, does not have the mental status to confidently extubate.  May be possible to consider extubation even with current neurological status as long as she has a good cough and gag.  Would be reasonable to try this before committing her tracheostomy -Continue to push aggressive pulmonary hygiene  DVT prophylaxis: -Continue enoxaparin 40 mg qd  GI prophylaxis: -Continue Protonix 40mg  qd  Frequent stool: Up to 10x/day. Likely combo of  opioid withdrawal and tube feeding diet.  -Continue fiber packet qd  Scurvy: Severely malnourished and history of gastric sleeve surgery. VitC <0.1. -Continue ascorbic acid (Vit C) 500 mg BID  Protein calorie Malnutrition,  severe: -Continue tube feeding via core track, Prosource supplementation -On thiamine, MVI -feeding supplement Prosource TF 45 mL qd -VITAL 1.5 CAL continuous feeding supplement -Multivitamin as ordered   Best Practice (right click and "Reselect all SmartList Selections" daily)   Diet/type: TF DVT prophylaxis: heparin GI prophylaxis: PPI Lines: foley, central line Code Status:  full code  Last date of multidisciplinary goals of care discussion-06/26/2021.  Updated patient's husband 06/28/2021 who would be her formal HC POA, cousin in the ICU.  Explained patient's status, guarded neurological prognosis, potential for lifelong neurological debilitation.  They indicate they do not believe the patient would want prolonged support.  Based on this there is consideration for possible one-way extubation, no reintubation if she were to fail to protect her airway.  Difficult family dynamic as patient's brother Harriett Sine had been principal contact and receiving information up until today 7/17.  Will need to try to reconnect the family members, try to come to consensus about aggressiveness of care, duration of mechanical ventilation to help decide whether patient would want to be reintubated if she fails extubation (or to have trach).  If there is no consensus ultimately decision making would fall to [place] or to his designee.  We will continue to discuss.  Family: Discussed the patient's status with husband [place], cousin and friend in the ICU on 7/17  CRITICAL CARE Performed by: 8/17  Total critical care time:  77  minutes, including goals  of care discussions  Critical care time was exclusive of separately billable procedures and treating other patients.  Critical care was necessary to treat or prevent imminent or life-threatening deterioration.  Critical care was time spent personally by me on the following activities: development of treatment plan with patient and/or surrogate as well as  nursing, discussions with consultants, evaluation of patient's response to treatment, examination of patient, obtaining history from patient or surrogate, ordering and performing treatments and interventions, ordering and review of laboratory studies, ordering and review of radiographic studies, pulse oximetry and re-evaluation of patient's condition.   Levy Pupa, MD, PhD 06/26/2021, 7:55 AM [facility] [phone] or if no answer before 7:00PM call [phone] For any issues after 7:00PM please call eLink [phone]

## 2021-06-26 NOTE — Progress Notes (Signed)
eLink Physician-Brief Progress Note Patient Name: Naly Schwanz DOB: [birth year] MRN: [id number]   Date of Service  06/26/2021  HPI/Events of Note  Agitation - Nursing request to renew bilateral soft wrist restraint.  eICU Interventions  Plan: Renew bilateral soft wrist restrints X 10 hours.      Intervention Category Major Interventions: Delirium, psychosis, severe agitation - evaluation and management  Gwenna Fuston [place] 06/26/2021, 11:01 PM

## 2021-06-26 NOTE — Progress Notes (Signed)
AM K+ 3.4 with creat 0.41 and GFR > 60. CCM ELink electrolyte protocol initiated.

## 2021-06-27 ENCOUNTER — Inpatient Hospital Stay (HOSPITAL_COMMUNITY): Payer: Medicaid Other

## 2021-06-27 LAB — BASIC METABOLIC PANEL
Anion gap: 7 (ref 5–15)
BUN: 16 mg/dL (ref 6–20)
CO2: 31 mmol/L (ref 22–32)
Calcium: 8.6 mg/dL — ABNORMAL LOW (ref 8.9–10.3)
Chloride: 105 mmol/L (ref 98–111)
Creatinine, Ser: 0.37 mg/dL — ABNORMAL LOW (ref 0.44–1.00)
GFR, Estimated: >60 mL/min (ref >60)
Glucose, Bld: 100 mg/dL — ABNORMAL HIGH (ref 70–99)
Potassium: 3.4 mmol/L — ABNORMAL LOW (ref 3.5–5.1)
Sodium: 143 mmol/L (ref 135–145)

## 2021-06-27 LAB — PHOSPHORUS: Phosphorus: 4.1 mg/dL (ref 2.5–4.6)

## 2021-06-27 LAB — CBC
HCT: 32.1 % — ABNORMAL LOW (ref 36.0–46.0)
Hemoglobin: 9.9 g/dL — ABNORMAL LOW (ref 12.0–15.0)
MCH: 28 pg (ref 26.0–34.0)
MCHC: 30.8 g/dL (ref 30.0–36.0)
MCV: 90.7 fL (ref 80.0–100.0)
Platelets: 323 10*3/uL (ref 150–400)
RBC: 3.54 MIL/uL — ABNORMAL LOW (ref 3.87–5.11)
RDW: 19.9 % — ABNORMAL HIGH (ref 11.5–15.5)
WBC: 11.6 10*3/uL — ABNORMAL HIGH (ref 4.0–10.5)
nRBC: 0 % (ref 0.0–0.2)

## 2021-06-27 LAB — MAGNESIUM: Magnesium: 2.4 mg/dL (ref 1.7–2.4)

## 2021-06-27 IMAGING — DX DG CHEST 1V PORT
1 series · 1 of 1 positions shown · non-contrast
Comparison: [DATE].

CLINICAL DATA: Acute on chronic respiratory failure with hypoxia.

EXAM:
PORTABLE CHEST 1 VIEW

[chest ap]
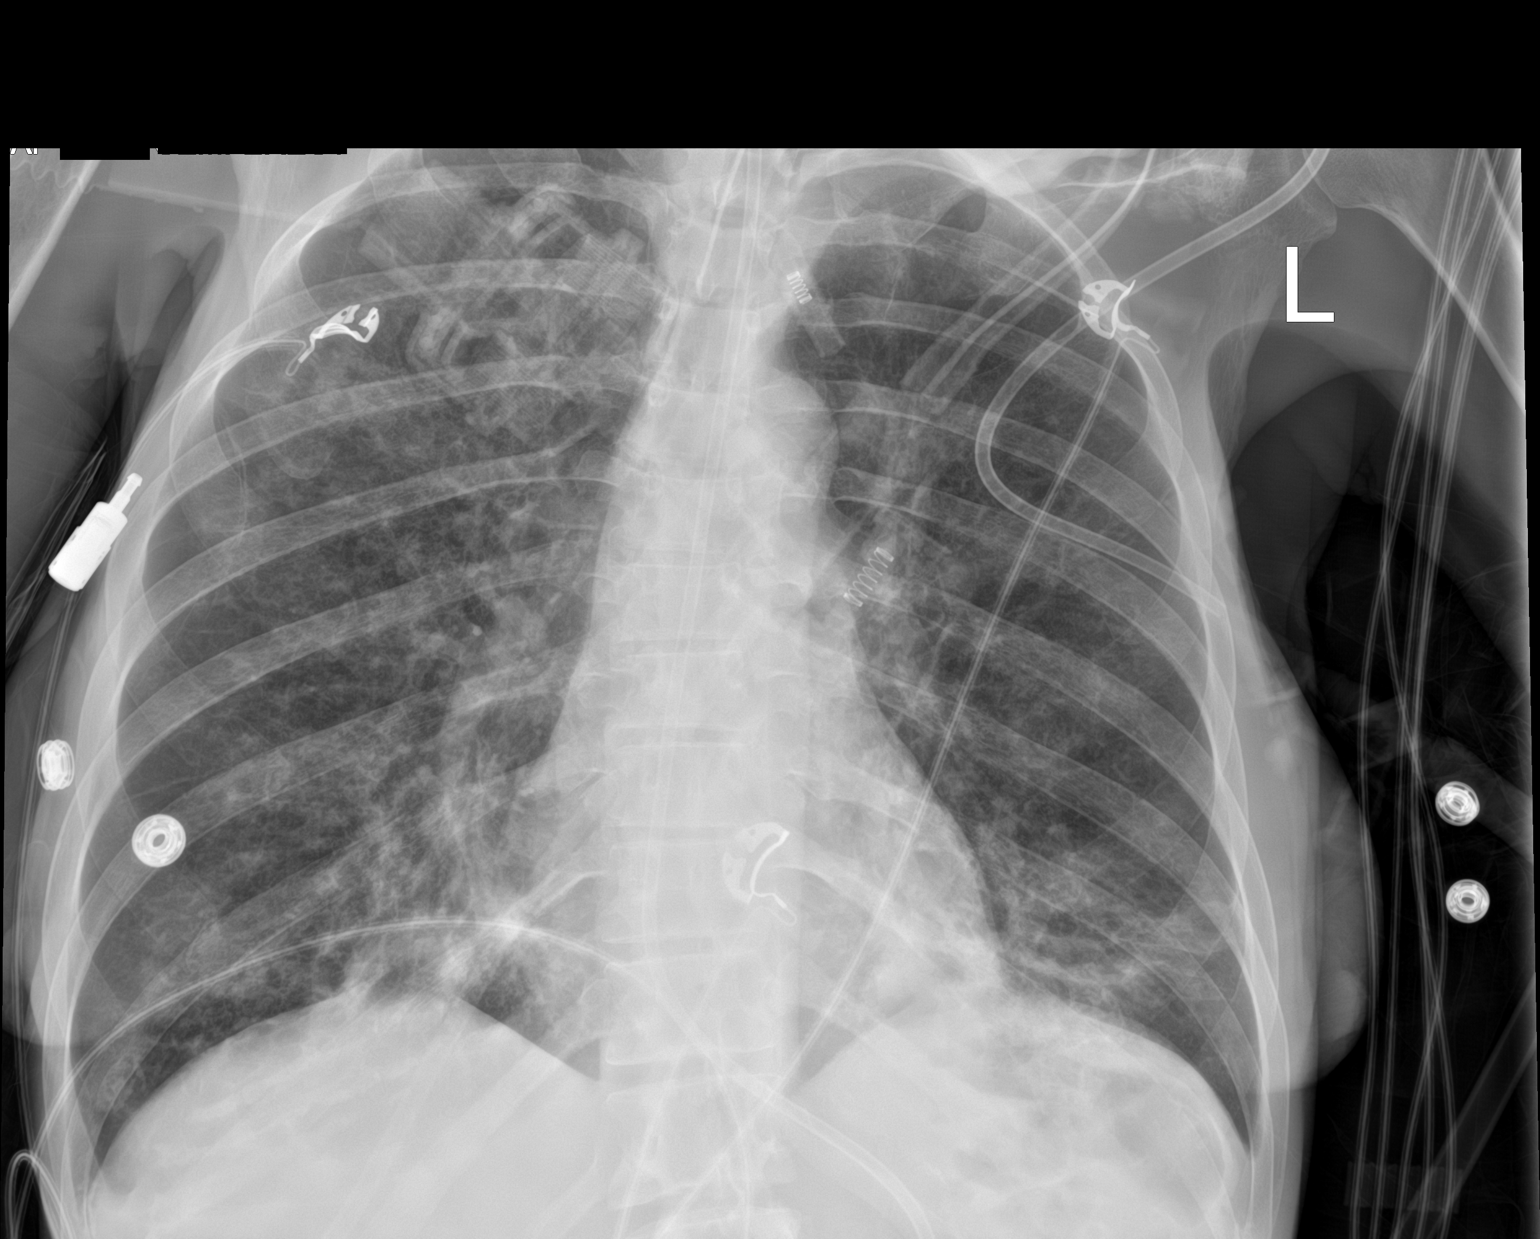

[1 of 1 positions shown; findings below may reference images not displayed]

FINDINGS: The heart size and mediastinal contours are within normal limits.
Endotracheal and feeding tubes are unchanged in position. Stable
bilateral lung opacities are noted particularly in the basilar
region. The visualized skeletal structures are unremarkable.
IMPRESSION: Stable support apparatus.  Stable bilateral lung opacities.

## 2021-06-27 MED ORDER — POTASSIUM CHLORIDE 20 MEQ PO PACK
40.0000 meq | PACK | Freq: Once | ORAL | Status: AC
  Administered 2021-06-27: 40 meq
  Filled 2021-06-27: qty 2

## 2021-06-27 MED ORDER — CALCIUM CARBONATE ANTACID 500 MG PO CHEW
1.0000 | CHEWABLE_TABLET | Freq: Three times a day (TID) | ORAL | Status: DC
  Administered 2021-06-27 – 2021-07-01 (×12): 200 mg
  Filled 2021-06-27 (×12): qty 1

## 2021-06-27 NOTE — Progress Notes (Signed)
NAME:  Sonia Howard, MRN:  [id number], DOB:  1980-12-16, LOS: 12 ADMISSION DATE:  06/15/2021, CONSULTATION DATE: 06/15/2021 REFERRING MD:  Sonia Plan, MD , CHIEF COMPLAINT: Found unresponsive  History of Present Illness:  Patient is intubated and unresponsive, most of the history is taken from chart review  40 year old female with history of drug overdose and unintentional weight loss who was brought into the emergency department after she was found unresponsive at her friend's house, her last known well was 5 PM yesterday.  Patient's vital signs were stable per EMS report but she was not responsive, her blood sugar was 23, she was given 2 A of D50 with that blood sugar improved to 250, she was given 2 mg of Narcan with no response.  Initially she received bag valve mask ventilation, in the emergency department she was intubated due to her GCS score being 3, in the emergency department patient blood sugar again was noted to be 12, she received D50 x2 and was started on D10. PCCM was consulted for help with evaluation and management  Collateral history per patient's brother: patient has a history of opioid use disorder, specifically fentanyl. Her father passed away in 2023-03-07, and per brother she was found trying to remove some of the fentanyl from her father's port line. She went to [facility] for rehab for 6 weeks in the beginning of April and was put on suboxone.  She is legally married to Sonia Howard but have been separated around a year. Per brother, he does not want to have anything to do with her. Patient was living with her friend Sonia Howard, and Sonia Howard's mom who also have a history of IVDU. Patient was found unresponsive by Sonia Howard's mom who called EMS.   Pertinent  Medical History  Unintentional weight loss, drug overdose, heroin and opioid use, chronic migraine, bariatric surgery  Significant Hospital Events: Including procedures, antibiotic start and stop dates in addition to  other pertinent events   7/6 admitted and intubated 7/7 ceftriaxone started 7/10 remains intubated, MRI, EEG neg, slowly improving neuro exam, off sedation 7/12 ceftriaxone finished 7/14 self-extubated (ETT anchor fell?) and re-intubated Resp cx 7/14 >> MSSA Vanco x 1 day 7/15; changed to cefazolin 7/16  Interim History / Subjective:  Sleeping in bed, no acute overnight events. Off sedation. Awakens to voice but not following commands   Objective   Blood pressure 101/60, pulse 69, temperature 98.2 F (36.8 C), temperature source Oral, resp. rate 20, height 5\' 8"  (1.727 m), weight 44.3 kg, SpO2 97 %.    Vent Mode: PRVC FiO2 (%):  [40 %-50 %] 50 % Set Rate:  [18 bmp] 18 bmp Vt Set:  [510 mL] 510 mL PEEP:  [5 cmH20] 5 cmH20 Pressure Support:  [5 cmH20] 5 cmH20 Plateau Pressure:  [15 cmH20-17 cmH20] 15 cmH20   Intake/Output Summary (Last 24 hours) at 06/27/2021 0849 Last data filed at 06/27/2021 0500 Gross per 24 hour  Intake 584.89 ml  Output 1000 ml  Net -415.11 ml   Filed Weights   06/24/21 0321 06/25/21 0115 06/26/21 0303  Weight: 45.7 kg 44.5 kg 44.3 kg   Examination: Sonia appearance: Thin critically ill woman, appears older than state age HENT: ET tube in place, pupils equal and reactive, oropharynx clear Lungs: Clear bilaterally, no crackles or wheezes CV: Regular rate and Rhythmin, no murmurs, gallops or rubs Abdomen: Nondistended, nontender, positive bowel sounds Extremities: No edema, bilateral knees flexed Skin: No rash, warm, dry Neuro: Eyes are  open spontaneously and to voice, intermittently tracking today.  Moving all extremities purposelessly, bilateral up going [name] Problem list   -Hypoglycemia -Leukopenia -Shock (possible stress cardiomyopathy) -Hypervolemia  Assessment & Howard:  Coma/Encephalopathy, possible anoxic or hypoglycemic brain injury, presumed drug overdose- exam concerning for profound neurological injury. Urine tox  screen negative. Found hypoglycemic (Glu as low as 12). Initial CT head neg. MRI unrevealing. EEG no seizure. Recent MRI, CTA head ok.  - Gradual improvement, discussion with neurology and family regarding prognosis of long recovery (months). -Appreciate neurology evaluation -Correct hypoglycemia (improved), metabolic disarray -Hold sedation -Course has been consistent with a significant neurological injury, presumed due to hypoglycemia, guarded prognosis for neuro recovery guarded -Safety restraints in place  Aspiration pneumonia, bilateral lower lungs- bilateral opacities on 7/7 CXR and completed course of ceftriaxone on 7/12.  Tracheal aspirate 7/14 grew few staph aureus with persistent bilateral lung opacities on CXR 7/14 and new leukocytosis WBC 12.6 (7/15). CXR 7/18 continues to demonstrate stable opacities.  -Continue cefazolin for MSSA on respiratory culture 7/14, Howard to continue through 7/19  Acute hypoxemic respiratory failure requiring intubation and mechanical ventilation- Patient tolerating well at FiO2 50% and PEEP 5 and intermittent PSV -If protecting airway consider trial of extubation prior to tracheostomy -Consult to palliative care for assistance with family meeting to discuss goals of care, greatly appreciate assistance -Continue to push aggressive pulmonary hygiene  DVT prophylaxis: -Continue enoxaparin 40 mg qd   GI prophylaxis: -Continue Protonix 40mg  qd  Frequent stool: Previously up to 10x/day. Likely combo of  opioid withdrawal and tube feeding diet, now improved -Continue fiber packet qd  Scurvy: Severely malnourished and history of gastric sleeve surgery. VitC <0.1. -Continue ascorbic acid (Vit C) 500 mg BID  Protein calorie Malnutrition, severe: -Continue tube feeding via coretrak at goal rate -Prosource TF 45 mL qd and  VITAL 1.5 CAL continuous 64mL/hr  - Will need to discuss with family for possible long term feeding tube in future if unable to be  extubated or unable to swallow -On thiamine and multivitamin  Best Practice (right click and "Reselect all SmartList Selections" daily)   Diet/type: TF DVT prophylaxis: heparin GI prophylaxis: PPI Lines: foley, central line Code Status:  full code  Last date of multidisciplinary goals of care discussion-06/26/2021.  Updated patient's husband 06/28/2021 who would be her formal HC POA, cousin in the ICU.  Explained patient's status, guarded neurological prognosis, potential for lifelong neurological debilitation.  They indicate they do not believe the patient would want prolonged support.  Based on this there is consideration for possible one-way extubation, no reintubation if she were to fail to protect her airway.  Difficult family dynamic as patient's brother Harriett Sine had been principal contact and receiving information up until today 7/17.  Will need to try to reconnect the family members, try to come to consensus about aggressiveness of care, duration of mechanical ventilation to help decide whether patient would want to be reintubated if she fails extubation (or to have trach).  If there is no consensus ultimately decision making would fall to [place] or to his designee.  We will continue to discuss.  [place], MD Internal Medicine, PGY-2 06/27/21 10:36 AM

## 2021-06-27 NOTE — Progress Notes (Signed)
Nutrition Follow-up  DOCUMENTATION CODES:   Underweight, Severe malnutrition in context of social or environmental circumstances  INTERVENTION:   Continue tube feeds via Cortrak: - Increase Vital 1.5 to new goal rate of 60 ml/hr (1440 ml/day) - ProSource TF 45 ml BID - Add free water flushes of 100 ml q 4 hours   New tube feeding regimen provides 2240 kcal, 119 grams of protein, and 1100 ml of H2O.  Total free water with flushes: 1700 ml  - Continue MVI with minerals BID per tube  - Continue calcium carbonate 500 mg TID per tube   - Continue zinc sulfate 220 mg BID per tube x 10 days for zinc deficiency   - Continue vitamin C 500 mg BID per tube for deficiency  NUTRITION DIAGNOSIS:   Severe Malnutrition related to social / environmental circumstances (polysubstance abuse) as evidenced by severe muscle depletion, severe fat depletion.  Ongoing, being addressed via TF  GOAL:   Patient will meet greater than or equal to 90% of their needs  Met via TF  MONITOR:   Vent status, Labs, Weight trends, TF tolerance, Skin, I & O's  REASON FOR ASSESSMENT:   Ventilator, Consult Enteral/tube feeding initiation and management  ASSESSMENT:   40 year old female who presented to the ED on 7/06 after being round unresponsive with very low blood sugar. PMH of IVDA, migraine, anxiety, depression, hypothyroidism, iron deficiency anemia, GERD, hx of overdose with opioid, tobacco abuse, sleeve gastrectomy in 2015. Pt required intubation.  7/08 - OG tube exchanged for post-pyloric Cortrak 7/11 - Cortrak repositioned as it was not flushing (tip post-pyloric) 7/14 - self-extubated, re-intubated  Discussed pt with RN and during ICU rounds. Pt with possible anoxic or hypoglycemic brain injury. PMT has been consulted to help facilitate [place] discussion. Pt with difficult family situation. Per RN, pt's cousin is now POA and wants to pursue one-way extubation after cousin arrives from out of  town.  Admit weight: 52.7 kg Current weight: 44.3 kg  Weight continues to trend down. Pt now without pitting edema so suspect some of weight loss can be attributed to fluid losses but also suspect true dry weight loss. Pt with severe malnutrition on admission. Pt is very active in the bed, tossing and turning and kicking legs. Suspect pt's activity contributing to increased kcal expenditure.  Patient is currently intubated on ventilator support MV: 6.7 L/min Temp (24hrs), Avg:98.9 F (37.2 C), Min:97.4 F (36.3 C), Max:99.5 F (37.5 C)  Medications reviewed and include: vitamin C 500 mg BID, calcium carbonate 500 mg TID, nutrisource fiber daily, MVI with minerals BID, protonix, thiamine 100 mg daily, zinc sulfate 220 mg BID, IV abx  Vitamin/Mineral Profile: Thiamine B1: 103.7 (WNL) Vitamin B12: 304 (WNL) Folate B9: 6.6 (WNL) Vitamin D: 48.47 (WNL) Vitamin C: <0.1 (low) Zinc: 33 (low)  Labs reviewed.  UOP: 700 ml x 12 hours I/O's: +2.7 L since admit  Diet Order:   Diet Order             Diet NPO time specified  Diet effective now                   EDUCATION NEEDS:   Not appropriate for education at this time  Skin:  Skin Assessment: Skin Integrity Issues: Stage I: coccyx, L ischial tuberosity, R ischial tuberosity  Last BM:  06/27/21 small type 5  Height:   Ht Readings from Last 1 Encounters:  06/15/21 $RemoveB'5\' 8"'[id number]$  (1.727 m)    Weight:  Wt Readings from Last 1 Encounters:  06/28/21 42.3 kg    BMI:  Body mass index is 14.18 kg/m.  Estimated Nutritional Needs:   Kcal:  1950-2150  Protein:  90-110 grams  Fluid:  >/= 1.8 L    Gustavus Bryant, MS, RD, LDN Inpatient Clinical Dietitian Please see AMiON for contact information.

## 2021-06-27 NOTE — Progress Notes (Signed)
AM K+ 3.4 with Creat 0.37 and GFR > 60. CCM Electrolyte protocol initiated.

## 2021-06-28 LAB — CBC
HCT: 33.3 % — ABNORMAL LOW (ref 36.0–46.0)
Hemoglobin: 10.1 g/dL — ABNORMAL LOW (ref 12.0–15.0)
MCH: 27.8 pg (ref 26.0–34.0)
MCHC: 30.3 g/dL (ref 30.0–36.0)
MCV: 91.7 fL (ref 80.0–100.0)
Platelets: 552 10*3/uL — ABNORMAL HIGH (ref 150–400)
RBC: 3.63 MIL/uL — ABNORMAL LOW (ref 3.87–5.11)
RDW: 19.4 % — ABNORMAL HIGH (ref 11.5–15.5)
WBC: 10.9 10*3/uL — ABNORMAL HIGH (ref 4.0–10.5)
nRBC: 0 % (ref 0.0–0.2)

## 2021-06-28 LAB — BASIC METABOLIC PANEL
Anion gap: 6 (ref 5–15)
BUN: 16 mg/dL (ref 6–20)
CO2: 31 mmol/L (ref 22–32)
Calcium: 8.5 mg/dL — ABNORMAL LOW (ref 8.9–10.3)
Chloride: 107 mmol/L (ref 98–111)
Creatinine, Ser: 0.44 mg/dL (ref 0.44–1.00)
GFR, Estimated: >60 mL/min (ref >60)
Glucose, Bld: 132 mg/dL — ABNORMAL HIGH (ref 70–99)
Potassium: 3.8 mmol/L (ref 3.5–5.1)
Sodium: 144 mmol/L (ref 135–145)

## 2021-06-28 MED ORDER — VITAL 1.5 CAL PO LIQD
1000.0000 mL | ORAL | Status: DC
  Administered 2021-06-28 – 2021-06-30 (×3): 1000 mL
  Filled 2021-06-28 (×2): qty 1000

## 2021-06-28 MED ORDER — PROSOURCE TF PO LIQD
45.0000 mL | Freq: Two times a day (BID) | ORAL | Status: DC
  Administered 2021-06-28 – 2021-07-01 (×7): 45 mL
  Filled 2021-06-28 (×6): qty 45

## 2021-06-28 MED ORDER — FREE WATER
100.0000 mL | Status: DC
  Administered 2021-06-28 – 2021-07-01 (×19): 100 mL

## 2021-06-28 MED ORDER — DOCUSATE SODIUM 50 MG/5ML PO LIQD
100.0000 mg | Freq: Two times a day (BID) | ORAL | Status: DC
  Administered 2021-06-28 – 2021-07-01 (×7): 100 mg
  Filled 2021-06-28 (×7): qty 10

## 2021-06-28 NOTE — Progress Notes (Signed)
NAME:  Dajai Wahlert, MRN:  [id number], DOB:  01-25-1981, LOS: 13 ADMISSION DATE:  06/15/2021, CONSULTATION DATE: 06/15/2021 REFERRING MD:  Maia Plan, MD , CHIEF COMPLAINT: Found unresponsive  History of Present Illness:  Patient is intubated and unresponsive, most of the history is taken from chart review  40 year old female with history of drug overdose and unintentional weight loss who was brought into the emergency department after she was found unresponsive at her friend's house, her last known well was 5 PM yesterday.  Patient's vital signs were stable per EMS report but she was not responsive, her blood sugar was 23, she was given 2 A of D50 with that blood sugar improved to 250, she was given 2 mg of Narcan with no response.  Initially she received bag valve mask ventilation, in the emergency department she was intubated due to her GCS score being 3, in the emergency department patient blood sugar again was noted to be 12, she received D50 x2 and was started on D10. PCCM was consulted for help with evaluation and management  Collateral history per patient's brother: patient has a history of opioid use disorder, specifically fentanyl. Her father passed away in 14-Mar-2023, and per brother she was found trying to remove some of the fentanyl from her father's port line. She went to [facility] for rehab for 6 weeks in the beginning of April and was put on suboxone.  She is legally married to [employer] but have been separated around a year. Per brother, he does not want to have anything to do with her. Patient was living with her friend Mardelle Matte, and Andy's mom who also have a history of IVDU. Patient was found unresponsive by Andy's mom who called EMS.   Pertinent  Medical History  Unintentional weight loss, drug overdose, heroin and opioid use, chronic migraine, bariatric surgery  Significant Hospital Events: Including procedures, antibiotic start and stop dates in addition to  other pertinent events   7/6 admitted and intubated 7/7 ceftriaxone started 7/10 remains intubated, MRI, EEG neg, slowly improving neuro exam, off sedation 7/12 ceftriaxone finished 7/14 self-extubated (ETT anchor fell?) and re-intubated Resp cx 7/14 >> MSSA Vanco x 1 day 7/15; changed to cefazolin 7/16  Interim History / Subjective:  Sleeping in bed, alerts to voice, not following commands. Remains intubated  Objective   Blood pressure 108/67, pulse 83, temperature 99.5 F (37.5 C), temperature source Axillary, resp. rate 16, height 5\' 8"  (1.727 m), weight 44.3 kg, SpO2 98 %.    Vent Mode: PRVC FiO2 (%):  [40 %] 40 % Set Rate:  [18 bmp] 18 bmp Vt Set:  [510 mL] 510 mL PEEP:  [5 cmH20] 5 cmH20 Pressure Support:  [5 cmH20] 5 cmH20 Plateau Pressure:  [14 cmH20-16 cmH20] 15 cmH20   Intake/Output Summary (Last 24 hours) at 06/28/2021 0722 Last data filed at 06/28/2021 0449 Gross per 24 hour  Intake 550 ml  Output 700 ml  Net -150 ml    Filed Weights   06/24/21 0321 06/25/21 0115 06/26/21 0303  Weight: 45.7 kg 44.5 kg 44.3 kg   Examination: General appearance: Thin critically ill woman, appears older than state age HENT: ET tube in place, pupils equal and reactive, oropharynx clear Lungs: Clear bilaterally, no crackles or wheezes CV: Regular rate and Rhythmin, no murmurs, gallops or rubs Abdomen: Nondistended, nontender, positive bowel sounds Extremities: No edema, bilateral knees flexed Skin: No rash, warm, dry Neuro: Eyes are open spontaneously and to voice, tracks  intermittently Moving all extremities   Resolved Hospital Problem list   -Hypoglycemia -Leukopenia -Shock (possible stress cardiomyopathy) -Hypervolemia  Assessment & Plan:  Coma/Encephalopathy, possible anoxic or hypoglycemic brain injury, presumed drug overdose- exam concerning for profound neurological injury. Urine tox screen negative. Found hypoglycemic (Glu as low as 12). Initial CT head neg. MRI  unrevealing. EEG no seizure. Recent MRI, CTA head ok.  - Gradual improvement, discussion with neurology and family regarding prognosis of long recovery (months). -Appreciate neurology evaluation -hold sedation -Course has been consistent with a significant neurological injury, presumed due to hypoglycemia, guarded prognosis for neuro recovery guarded -Safety restraints in place  Aspiration pneumonia, bilateral lower lungs- bilateral opacities on 7/7 CXR and completed course of ceftriaxone on 7/12.  Tracheal aspirate 7/14 grew few staph aureus with persistent bilateral lung opacities on CXR 7/14 and new leukocytosis WBC 12.6 (7/15). WBC improving, remains afebrile -Last day of cefazolin today  Acute hypoxemic respiratory failure requiring intubation and mechanical ventilation- Patient tolerating well at FiO2 40% and PEEP 5 and intermittent PSV. Husband called to transfer healthcare proxy to Dimas Alexandria -Possible trial of extubation later today after discussion with Tammy -Consult to palliative care  -Continue to push aggressive pulmonary hygiene  DVT prophylaxis: -Continue enoxaparin 40 mg qd   GI prophylaxis: -Continue Protonix 40mg  qd  Frequent stool: Previously up to 10x/day. Likely combo of  opioid withdrawal and tube feeding diet, now improved. No charted stools since 7/14, will monitor for constipation -Continue fiber packet qd  Scurvy: Severely malnourished and history of gastric sleeve surgery. VitC <0.1. -Continue ascorbic acid (Vit C) 500 mg BID  Protein calorie Malnutrition, severe: -Continue tube feeding via coretrak at goal rate -Prosource TF 45 mL qd and  VITAL 1.5 CAL continuous 41mL/hr  - Will need to discuss with family for possible long term feeding tube in future if unable to be extubated or unable to swallow -On thiamine and multivitamin  Best Practice (right click and "Reselect all SmartList Selections" daily)  Diet/type: TF DVT prophylaxis: heparin GI  prophylaxis: PPI Lines: foley, central line Code Status:  full code  Last date of multidisciplinary goals of care discussion-06/28/2021 Husband calling this morning. Would like for 06/30/2021 to be decision maker, and would like to avoid further update on the patient. Plan for Tammy to visit this afternoon from [place], will discuss extubation and goals of care should she decompensate off ventilator.  New Nathan, MD Internal Medicine, PGY-2 06/28/21 7:22 AM

## 2021-06-28 NOTE — Progress Notes (Signed)
Patient's husband Irwin Brakeman called this morning. Expressed desire to transfer POA to patient's cousin Dimas Alexandria, Husband would no longer like update regarding patient's care. He is unable to provide contact information of Tammy, she will call to provide updated contact information.

## 2021-06-28 NOTE — Procedures (Signed)
Extubation Procedure Note  Patient Details:   Name: Ronny Ruddell DOB: 1981-04-23 MRN: [id number]   Airway Documentation:    Vent end date: 06/28/21 Vent end time: 1845   Evaluation  O2 sats: stable throughout Complications: No apparent complications Patient did tolerate procedure well. Bilateral Breath Sounds: Diminished   No  Toula Moos 06/28/2021, 7:09 PM

## 2021-06-28 NOTE — TOC Progression Note (Addendum)
Transition of Care [facility]) - Progression Note    Patient Details  Name: Santiago Graf MRN: [id number] Date of Birth: [birth year]  Transition of Care [facility]) CM/SW Contact  Okey Dupre Lazaro Arms, LCSW Phone Number: 06/28/2021, 6:45 PM  Clinical Narrative:  CSW informed that patient's estranged husband no longer wants to be contacted regarding patient and has made Dimas Alexandria the contact (POA). Her number is [phone]. CSW was informed that this is the person who cares for the patient's child. Case manager Vance Peper also contacted regarding situation.       Barriers to Discharge: Continued Medical Work up  Expected Discharge Plan and Services   In-house Referral: Clinical Social Work, Hospice / Palliative Care Discharge Planning Services: CM Consult   Living arrangements for the past 2 months: Single Family Home                                     Social Determinants of Health (SDOH) Interventions    Readmission Risk Interventions No flowsheet data found.

## 2021-06-29 ENCOUNTER — Encounter (HOSPITAL_COMMUNITY): Payer: Self-pay | Admitting: Internal Medicine

## 2021-06-29 DIAGNOSIS — Z515 Encounter for palliative care: Secondary | ICD-10-CM

## 2021-06-29 DIAGNOSIS — Z7189 Other specified counseling: Secondary | ICD-10-CM

## 2021-06-29 DIAGNOSIS — J9621 Acute and chronic respiratory failure with hypoxia: Secondary | ICD-10-CM

## 2021-06-29 LAB — BASIC METABOLIC PANEL
Anion gap: 6 (ref 5–15)
BUN: 19 mg/dL (ref 6–20)
CO2: 32 mmol/L (ref 22–32)
Calcium: 8.6 mg/dL — ABNORMAL LOW (ref 8.9–10.3)
Chloride: 106 mmol/L (ref 98–111)
Creatinine, Ser: 0.42 mg/dL — ABNORMAL LOW (ref 0.44–1.00)
GFR, Estimated: >60 mL/min (ref >60)
Glucose, Bld: 103 mg/dL — ABNORMAL HIGH (ref 70–99)
Potassium: 3.2 mmol/L — ABNORMAL LOW (ref 3.5–5.1)
Sodium: 144 mmol/L (ref 135–145)

## 2021-06-29 LAB — CBC
HCT: 35.6 % — ABNORMAL LOW (ref 36.0–46.0)
Hemoglobin: 10.8 g/dL — ABNORMAL LOW (ref 12.0–15.0)
MCH: 28.1 pg (ref 26.0–34.0)
MCHC: 30.3 g/dL (ref 30.0–36.0)
MCV: 92.7 fL (ref 80.0–100.0)
Platelets: 567 10*3/uL — ABNORMAL HIGH (ref 150–400)
RBC: 3.84 MIL/uL — ABNORMAL LOW (ref 3.87–5.11)
RDW: 19.2 % — ABNORMAL HIGH (ref 11.5–15.5)
WBC: 9.5 10*3/uL (ref 4.0–10.5)
nRBC: 0 % (ref 0.0–0.2)

## 2021-06-29 MED ORDER — WHITE PETROLATUM EX OINT
TOPICAL_OINTMENT | CUTANEOUS | Status: AC
  Administered 2021-06-29: 1
  Filled 2021-06-29: qty 28.35

## 2021-06-29 MED ORDER — POTASSIUM CHLORIDE 10 MEQ/100ML IV SOLN
10.0000 meq | INTRAVENOUS | Status: AC
  Administered 2021-06-29 (×4): 10 meq via INTRAVENOUS
  Filled 2021-06-29 (×3): qty 100

## 2021-06-29 MED ORDER — POTASSIUM CHLORIDE 20 MEQ PO PACK
20.0000 meq | PACK | ORAL | Status: AC
Start: 2021-06-29 — End: 2021-06-29
  Administered 2021-06-29 (×2): 20 meq
  Filled 2021-06-29 (×2): qty 1

## 2021-06-29 NOTE — Progress Notes (Signed)
NAME:  Sonia Howard, MRN:  [id number], DOB:  05-09-1981, LOS: 14 ADMISSION DATE:  06/15/2021, CONSULTATION DATE: 06/15/2021 REFERRING MD:  Maia Plan, MD , CHIEF COMPLAINT: Found unresponsive  History of Present Illness:  Patient is intubated and unresponsive, most of the history is taken from chart review  40 year old female with history of drug overdose and unintentional weight loss who was brought into the emergency department after she was found unresponsive at her friend's house, her last known well was 5 PM yesterday.  Patient's vital signs were stable per EMS report but she was not responsive, her blood sugar was 23, she was given 2 A of D50 with that blood sugar improved to 250, she was given 2 mg of Narcan with no response.  Initially she received bag valve mask ventilation, in the emergency department she was intubated due to her GCS score being 3, in the emergency department patient blood sugar again was noted to be 12, she received D50 x2 and was started on D10. PCCM was consulted for help with evaluation and management  Collateral history per patient's brother: patient has a history of opioid use disorder, specifically fentanyl. Her father passed away in 03/10/2023, and per brother she was found trying to remove some of the fentanyl from her father's port line. She went to [facility] for rehab for 6 weeks in the beginning of April and was put on suboxone.  She is legally married to [employer] but have been separated around a year. Per brother, he does not want to have anything to do with her. Patient was living with her friend Mardelle Matte, and Andy's mom who also have a history of IVDU. Patient was found unresponsive by Andy's mom who called EMS.   Pertinent  Medical History  Unintentional weight loss, drug overdose, heroin and opioid use, chronic migraine, bariatric surgery  Significant Hospital Events: Including procedures, antibiotic start and stop dates in addition to  other pertinent events   7/6 admitted and intubated 7/7 ceftriaxone started 7/10 remains intubated, MRI, EEG neg, slowly improving neuro exam, off sedation 7/12 ceftriaxone finished 7/14 self-extubated (ETT anchor fell?) and re-intubated Resp cx 7/14 >> MSSA Vanco x 1 day 7/15; changed to cefazolin 7/16 7/19 extubated, cefazolin finished   Interim History / Subjective:  Awake, sitting up in bed. Extubated yesterday afternoon. Family is at bedside. Moving all extremities, not following commands    Objective   Blood pressure 112/69, pulse 83, temperature (!) 97.5 F (36.4 C), temperature source Oral, resp. rate 17, height 5\' 8"  (1.727 m), weight 41.9 kg, SpO2 98 %.    Vent Mode: CPAP;PSV FiO2 (%):  [40 %] 40 % PEEP:  [5 cmH20] 5 cmH20 Pressure Support:  [5 cmH20] 5 cmH20   Intake/Output Summary (Last 24 hours) at 06/29/2021 1337 Last data filed at 06/29/2021 1300 Gross per 24 hour  Intake 1659.82 ml  Output 1300 ml  Net 359.82 ml   Filed Weights   06/26/21 0303 06/28/21 1010 06/29/21 0500  Weight: 44.3 kg 42.3 kg 41.9 kg   Examination: General appearance: Thin critically ill woman, appears older than state age, in no acute distress HENT: pupils equal and reactive, oropharynx clear Lungs: Clear bilaterally, no crackles or wheezes CV: Regular rate and Rhythm, no murmurs, gallops or rubs Abdomen: Nondistended, nontender, positive bowel sounds Extremities: No edema Skin: No rash, warm, dry Neuro: Awake, sitting up in bed, looking around the room, non verbal, not following commands, moves all extremities spontenously  Resolved Hospital Problem list   -Hypoglycemia -Leukopenia -Shock (possible stress cardiomyopathy) -Hypervolemia  Assessment & Plan:  Coma/Encephalopathy, possible anoxic or hypoglycemic brain injury, presumed drug overdose- Urine tox screen negative. Found hypoglycemic (Glu as low as 12). Initial CT head neg. MRI unrevealing. EEG no seizure. Recent MRI, CTA  head ok.  -Course has been consistent with a significant neurological injury, presumed due to hypoglycemia, guarded prognosis for neuro recovery  -Extubated 7/19, discussed with Tammy who feels patient would no want further intubation or feed tube, no plans for escalation of care -Safety restraints in place -palliative care consulted  - Stable to transfer out of ICU  Acute hypoxemic respiratory failure requiring intubation and mechanical ventilation- Extubated on 7/19 tolerated 4L [place] - O2 as needed for saturation <92% - Patient DNR, no plans to escalate care   Scurvy: Severely malnourished and history of gastric sleeve surgery. VitC <0.1. -Continue ascorbic acid (Vit C) 500 mg BID  Protein calorie Malnutrition, severe: -Continue tube feeding via coretrak at goal rate -Prosource TF 45 mL qd and  VITAL 1.5 CAL continuous 76mL/hr  -Monitor mental status, no plans for placing feeding tube -On thiamine and multivitamin  Best Practice (right click and "Reselect all SmartList Selections" daily)  Diet/type: TF DVT prophylaxis: heparin GI prophylaxis: PPI Lines: foley Code Status:  full code  Last date of multidisciplinary goals of care discussion-06/28/2021. Discussed with Dimas Alexandria, who reports patient has in the past said she would not want to be intubated to have a feeding tube. Discussed trajectory of patient's health, neurological recovery, and ongoing opioid use disorder. Tammy would like to continue to monitor patient for further neurological recovery, but would avoid further escalation of care including reintubation, tracheostomy, or feeding tube.   Quincy Simmonds, MD Internal Medicine, PGY-2 06/29/21 1:37 PM

## 2021-06-29 NOTE — Consult Note (Signed)
Consultation Note Date: 06/29/2021   Patient Name: Sonia Howard  DOB: [birth year]  MRN: [id number]  Age / Sex: 40 y.o., female  PCP: No primary care provider on file. Referring Physician: Kipp Brood, MD  Reason for Consultation: Establishing goals of care  HPI/Patient Profile: 40 y.o. female  with past medical history of drug overdose and unintentional weight loss admitted on 06/15/2021 with acute metabolic encephalopathy likely in the setting og hypoglycemia.   Clinical Assessment and Goals of Care: I have reviewed medical records including EPIC notes, labs and imaging, received report from RN, assessed the patient.  Sonia Howard is lying quietly in bed.  She appears acutely/chronically ill and frail.  Her eyes are open, but she does not make eye contact or respond to my voice.  There is no family at bedside at this time.   Call to first cousin, Murlean Iba, to discuss diagnosis prognosis, [place], EOL wishes, disposition and options.  Left detailed VM message.    Detailed discussion with bedside nursing staff related to patient condition, needs.     PMT will continue to support holistically.   HCPOA   OTHER - First cousin, Murlean Iba who lives in [place].  Estranged husband gives Tammy his proxy and she is willing to be Media planner.  Tammy is also caring for Sonia Howard's 67 month old son.     SUMMARY OF RECOMMENDATIONS   Continue to treat, No CPR or intubation Time for outcomes.  Considering comfort care if no meaningful improvement in 1 week.  PMT to follow.    Code Status/Advance Care Planning: DNR  Symptom Management:  Per hospitalist/CCM, no additional needs at this time.  Palliative Prophylaxis:  Frequent Pain Assessment, Oral Care, and Turn Reposition  Additional Recommendations (Limitations, Scope, Preferences): Continue to treat the treatable, time for  outcomes  Psycho-social/Spiritual:  Desire for further Chaplaincy support:no Additional Recommendations: Caregiving  Support/Resources and Education on Hospice  Prognosis:  < 2 weeks, would not be surprising based on chronic illness burden,, decline, current functional status.  Discharge Planning:  To be determined.  Would qualify for residential hospice if family were to so desire.       Primary Diagnoses: Present on Admission:  Acute encephalopathy   I have reviewed the medical record, interviewed the patient and family, and examined the patient. The following aspects are pertinent.  History reviewed. No pertinent past medical history. Social History   Socioeconomic History   Marital status: Unknown    Spouse name: Not on file   Number of children: Not on file   Years of education: Not on file   Highest education level: Not on file  Occupational History   Not on file  Tobacco Use   Smoking status: Not on file   Smokeless tobacco: Not on file  Substance and Sexual Activity   Alcohol use: Not on file   Drug use: Not on file   Sexual activity: Not on file  Other Topics Concern   Not on file  Social History Narrative  Not on file   Social Determinants of Health   Financial Resource Strain: Not on file  Food Insecurity: Not on file  Transportation Needs: Not on file  Physical Activity: Not on file  Stress: Not on file  Social Connections: Not on file   History reviewed. No pertinent family history. Scheduled Meds:  vitamin C  500 mg Per Tube BID   calcium carbonate  1 tablet Per Tube TID   chlorhexidine gluconate (MEDLINE KIT)  15 mL Mouth Rinse BID   Chlorhexidine Gluconate Cloth  6 each Topical Daily   docusate  100 mg Per Tube BID   enoxaparin (LOVENOX) injection  30 mg Subcutaneous Q24H   feeding supplement (PROSource TF)  45 mL Per Tube BID   fiber  1 packet Per Tube Daily   free water  100 mL Per Tube Q4H   multivitamin with minerals  1 tablet Per  Tube BID   pantoprazole sodium  40 mg Per Tube Daily   thiamine  100 mg Per Tube Daily   zinc sulfate  220 mg Per Tube BID   Continuous Infusions:  sodium chloride 10 mL/hr at 06/29/21 1300   sodium chloride     sodium chloride Stopped (06/21/21 1408)   feeding supplement (VITAL 1.5 CAL) 1,000 mL (06/29/21 1305)   PRN Meds:.docusate, fentaNYL (SUBLIMAZE) injection, ondansetron (ZOFRAN) IV, polyethylene glycol Medications Prior to Admission:  Prior to Admission medications   Not on File   Not on File Review of Systems  Unable to perform ROS: Patient unresponsive   Physical Exam Vitals and nursing note reviewed.  Constitutional:      General: She is not in acute distress.    Appearance: She is ill-appearing.  HENT:     Head: Atraumatic.     Mouth/Throat:     Mouth: Mucous membranes are moist.  Cardiovascular:     Rate and Rhythm: Normal rate.  Pulmonary:     Effort: Pulmonary effort is normal. No respiratory distress.  Abdominal:     General: Abdomen is flat. There is no distension.  Musculoskeletal:        General: No swelling.  Skin:    General: Skin is warm and dry.  Neurological:     Comments: Does not respond to voice or touch    Vital Signs: BP 112/69   Pulse 83   Temp 97.6 F (36.4 C) (Oral)   Resp 17   Ht $R'5\' 8"'BN$  (1.727 m)   Wt 41.9 kg   LMP  (LMP Unknown) Comment: Pt intubated at the time of x-ray. NEG preg test on 07/06  SpO2 98%   BMI 14.05 kg/m  Pain Scale: CPOT POSS *See Group Information*: 1-Acceptable,Awake and alert Pain Score: 0-No pain   SpO2: SpO2: 98 % O2 Device:SpO2: 98 % O2 Flow Rate: .O2 Flow Rate (L/min): 6 L/min  IO: Intake/output summary:  Intake/Output Summary (Last 24 hours) at 06/29/2021 1530 Last data filed at 06/29/2021 1300 Gross per 24 hour  Intake 1539.82 ml  Output 1300 ml  Net 239.82 ml    LBM: Last BM Date: 06/27/21 Baseline Weight: Weight: 60 kg Most recent weight: Weight: 41.9 kg     Palliative  Assessment/Data:   Flowsheet Rows    Flowsheet Row Most Recent Value  Intake Tab   Referral Department Hospitalist  Unit at Time of Referral Med/Surg Unit  Palliative Care Primary Diagnosis Neurology  Date Notified 06/27/21  Palliative Care Type New Palliative care  Reason for referral Clarify  Goals of Care  Date of Admission 06/15/21  Date first seen by Palliative Care 06/29/21  # of days Palliative referral response time 2 Day(s)  # of days IP prior to Palliative referral 12  Clinical Assessment   Palliative Performance Scale Score 20%  Pain Max last 24 hours Not able to report  Pain Min Last 24 hours Not able to report  Dyspnea Max Last 24 Hours Not able to report  Dyspnea Min Last 24 hours Not able to report  Psychosocial & Spiritual Assessment   Palliative Care Outcomes        Time In: 1000 Time Out: 1050 Time Total: 50 minutes  Greater than 50%  of this time was spent counseling and coordinating care related to the above assessment and plan.  Signed by: Drue Novel, NP   Please contact Palliative Medicine Team phone at [phone] for questions and concerns.  For individual provider: See Shea Evans

## 2021-06-29 NOTE — Progress Notes (Signed)
Nutrition Follow-up  DOCUMENTATION CODES:   Underweight, Severe malnutrition in context of social or environmental circumstances  INTERVENTION:   Continue tube feeds via Cortrak: -Vital 1.5 @ 60 ml/hr (1440 ml/day) - ProSource TF 45 ml BID - Free water flushes of 100 ml q 4 hours   Tube feeding regimen provides 2240 kcal, 119 grams of protein, and 1100 ml of H2O.   Total free water with flushes: 1700 ml   - Continue MVI with minerals BID per tube   - Continue calcium carbonate 500 mg TID per tube   - Continue vitamin C 500 mg BID per tube for deficiency  NUTRITION DIAGNOSIS:   Severe Malnutrition related to social / environmental circumstances (polysubstance abuse) as evidenced by severe muscle depletion, severe fat depletion.  Ongoing, being addressed via TF  GOAL:   Patient will meet greater than or equal to 90% of their needs  Met via TF  MONITOR:   Vent status, Labs, Weight trends, TF tolerance, Skin, I & O's  REASON FOR ASSESSMENT:   Ventilator, Consult Enteral/tube feeding initiation and management  ASSESSMENT:   40 year old female who presented to the ED on 7/06 after being round unresponsive with very low blood sugar. PMH of IVDA, migraine, anxiety, depression, hypothyroidism, iron deficiency anemia, GERD, hx of overdose with opioid, tobacco abuse, sleeve gastrectomy in 2015. Pt required intubation.  7/08 - OG tube exchanged for post-pyloric Cortrak 7/11 - Cortrak repositioned as it was not flushing (tip post-pyloric) 7/14 - self-extubated, re-intubated 7/19 - extubated  Discussed pt with RN and during ICU rounds. Pt extubated yesterday evening. Plan is to monitor pt's mental status without further escalation of care. Per notes, family does not want reintubation, tracheostomy, or permanent feeding tube.  Will continue to monitor weights and adjust tube feeding regimen as appropriate. Weight down again today.  Admit weight: 52.7 kg Current weight: 41.9  kg  Medications reviewed and include: vitamin C 500 mg BID, calcium carbonate 500 mg TID, colace, nutrisource fiber daily, MVI with minerals BID, protonix, thiamine, zinc sulfate 220 mg BID (end date today)  Vitamin/Mineral Profile: Thiamine B1: 103.7 (WNL) Vitamin B12: 304 (WNL) Folate B9: 6.6 (WNL) Vitamin D: 48.47 (WNL) Vitamin C: <0.1 (low) Zinc: 33 (low)  Labs reviewed: potassium 3.2  UOP: 750 ml x 12 hours I/O's: +3.4 L since admit  Diet Order:   Diet Order             Diet NPO time specified  Diet effective now                   EDUCATION NEEDS:   Not appropriate for education at this time  Skin:  Skin Assessment: Skin Integrity Issues: Stage I: coccyx, L ischial tuberosity, R ischial tuberosity  Last BM:  06/27/21 small type 5  Height:   Ht Readings from Last 1 Encounters:  06/15/21 $RemoveB'5\' 8"'cJTnAwGg$  (1.727 m)    Weight:   Wt Readings from Last 1 Encounters:  06/29/21 41.9 kg    BMI:  Body mass index is 14.05 kg/m.  Estimated Nutritional Needs:   Kcal:  1950-2150  Protein:  90-110 grams  Fluid:  >/= 1.8 L    Gustavus Bryant, MS, RD, LDN Inpatient Clinical Dietitian Please see AMiON for contact information.

## 2021-06-29 NOTE — Progress Notes (Signed)
[facility] ADULT ICU REPLACEMENT PROTOCOL   The patient does apply for the [facility] Adult ICU Electrolyte Replacment Protocol based on the criteria listed below:   1.Exclusion criteria: TCTS patients, ECMO patients and Hypothermia Protocol, and   Dialysis patients 2. Is GFR >/= 30 ml/min? Yes.    Patient's GFR today is >60 3. Is SCr </= 2? Yes.   Patient's SCr is 0.42 mg/dL 4. Did SCr increase >/= 0.5 in 24 hours? No. 5.Pt's weight >40kg  Yes.   6. Abnormal electrolyte(s):  K 3.2  7. Electrolytes replaced per protocol 8.  Call MD STAT for K+ </= 2.5, Phos </= 1, or Mag </= 1 Physician:  Shawn Stall R Lyllie Cobbins 06/29/2021 5:06 AM

## 2021-06-30 DIAGNOSIS — J9621 Acute and chronic respiratory failure with hypoxia: Secondary | ICD-10-CM

## 2021-06-30 DIAGNOSIS — Z515 Encounter for palliative care: Secondary | ICD-10-CM

## 2021-06-30 DIAGNOSIS — Z7189 Other specified counseling: Secondary | ICD-10-CM

## 2021-06-30 LAB — BASIC METABOLIC PANEL
Anion gap: 6 (ref 5–15)
BUN: 20 mg/dL (ref 6–20)
CO2: 31 mmol/L (ref 22–32)
Calcium: 8.6 mg/dL — ABNORMAL LOW (ref 8.9–10.3)
Chloride: 105 mmol/L (ref 98–111)
Creatinine, Ser: 0.39 mg/dL — ABNORMAL LOW (ref 0.44–1.00)
GFR, Estimated: >60 mL/min (ref >60)
Glucose, Bld: 121 mg/dL — ABNORMAL HIGH (ref 70–99)
Potassium: 3.8 mmol/L (ref 3.5–5.1)
Sodium: 142 mmol/L (ref 135–145)

## 2021-06-30 LAB — PHOSPHORUS: Phosphorus: 3.2 mg/dL (ref 2.5–4.6)

## 2021-06-30 LAB — GLUCOSE, CAPILLARY: Glucose-Capillary: 131 mg/dL — ABNORMAL HIGH (ref 70–99)

## 2021-06-30 LAB — MAGNESIUM: Magnesium: 2.2 mg/dL (ref 1.7–2.4)

## 2021-06-30 MED ORDER — VITAL 1.5 CAL PO LIQD
1000.0000 mL | ORAL | Status: DC
  Administered 2021-06-30: 1000 mL
  Filled 2021-06-30: qty 1000

## 2021-06-30 NOTE — Progress Notes (Signed)
PROGRESS NOTE    Sonia Howard  IDP:[id number] DOB: 09/02/1981 DOA: 06/15/2021 PCP: No primary care provider on file.   Brief Narrative:  40 year old female with history of drug overdose and unintentional weight loss who was brought into the emergency department after she was found unresponsive at her friend's house, intubated due to her GCS score being 3, with profound hypoglycemia - did not respond to narcan.   Collateral history per patient's brother: patient has a history of opioid use disorder, specifically fentanyl. Her father passed away in 06-Mar-2023, and per brother she was found trying to remove some of the fentanyl from her father's port line. She went to [facility] for rehab for 6 weeks in the beginning of April and was put on suboxone.  She is legally married to [employer] but have been separated around a year. Per brother, he does not want to have anything to do with her. Patient was living with her friend Jonni Sanger, and Andy's mom who also have a history of IVDU. Patient was found unresponsive by Andy's mom who called EMS.  Lenghty hospital stay - now extubated but making minimal recovery - family and HCPOA discussed with care team, patient would not want to be place back on vent nor would she want a permanent feeding tube. Will pursue supportive care - if no improvement by early next week (July 25-26th) would transition to hospice per discussion with palliative care.  7/6 admitted and intubated 7/7 ceftriaxone started 7/10 remains intubated, MRI, EEG neg, slowly improving neuro exam, off sedation 7/12 ceftriaxone finished 7/14 self-extubated (ETT anchor fell?) and re-intubated Resp cx 7/14 >> MSSA Vanco x 1 day 7/15; changed to cefazolin 7/16 7/19 extubated, cefazolin finished  7/20 - ongoing supportive care; minimal improvement  Assessment & Plan:   Active Problems:   Acute encephalopathy   Pressure injury of skin   Protein-calorie malnutrition, severe    Acute respiratory failure with hypoxia (HCC)   Coma/Encephalopathy, presumed drug overdose with anoxic brain injury vs hypoglycemic coma UDS negative; hypoglycemic but did not improve with glucose CT head neg. MRI unrevealing. EEG no seizure. Repeat MRI, CTA head unremarkable. Extubated 7/19, family agreed to not re-intubate - no permanent feeding tube Continue supportive care - palliative following for possible hospice next week if no meaningful recover   Acute hypoxemic respiratory failure requiring intubation and mechanical ventilation, POA Resolving Continue supplemental oxygen - no plans to escalate Currently on 6L [place] - will continue to wean as tolerated   Sever malnutritional status Failure to thrive Scurvy:  Severely malnourished and history of gastric sleeve surgery.  VitC <0.1 essentially undetectable - continue to replete along with other multivitamin/thiamine Continue tube feeds as tolerated - vital 1.5 continuous   DVT prophylaxis: lovenox Code Status: DNR Family Communication: Updated  Status is: Inpt  Dispo: The patient is from: Home              Anticipated d/c is to: TBD              Anticipated d/c date is: 85-96h              Patient currently NOT medically stable for discharge  Consultants:  PCCM, Neuro  Antimicrobials:  Zosyn --> Ceftriaxone --> Cefazolin; completed   Subjective: No acute issues/events overnight - ROS severely limited  Objective: Vitals:   06/30/21 0345 06/30/21 0400 06/30/21 0500 06/30/21 0600  BP:  112/73 111/81 116/65  Pulse:  84 87 88  Resp:  14  14 15  Temp:      TempSrc:      SpO2:  95% 94% 97%  Weight: 41.2 kg     Height:        Intake/Output Summary (Last 24 hours) at 06/30/2021 0737 Last data filed at 06/30/2021 0600 Gross per 24 hour  Intake 2064.87 ml  Output 1160 ml  Net 904.87 ml   Filed Weights   06/28/21 1010 06/29/21 0500 06/30/21 0345  Weight: 42.3 kg 41.9 kg 41.2 kg    Examination:  General exam:  Cachectic, somnolent and minimally arousable; blinks to threat but otherwise minimally responsive, protecting airway Respiratory system: Clear to auscultation. Respiratory effort normal. Cardiovascular system: S1 & S2 heard, RRR. No JVD, murmurs, rubs, gallops or clicks. No pedal edema. Gastrointestinal system: Abdomen is nondistended, soft and nontender. No organomegaly or masses felt. Normal bowel sounds heard. Central nervous system: Somnolent but arousable - blinks to threat, minimally interactive otherwise - does move all 4 extremities without overt deficits.. Extremities: Symmetric 5 x 5 power. Skin: No rashes, lesions or ulcers Psychiatry: Judgement and insight appear normal. Mood & affect appropriate.   Data Reviewed: I have personally reviewed following labs and imaging studies  CBC: Recent Labs  Lab 06/25/21 0024 06/26/21 0210 06/27/21 0318 06/28/21 0105 06/29/21 0109  WBC 14.0* 12.0* 11.6* 10.9* 9.5  HGB 10.0* 10.2* 9.9* 10.1* 10.8*  HCT 31.4* 32.6* 32.1* 33.3* 35.6*  MCV 89.2 90.3 90.7 91.7 92.7  PLT 378 448* 323 552* 381*   Basic Metabolic Panel: Recent Labs  Lab 06/23/21 0745 06/24/21 0156 06/26/21 0210 06/27/21 0318 06/28/21 0105 06/29/21 0109 06/30/21 0225  NA  --    < > 140 143 144 144 142  K  --    < > 3.4* 3.4* 3.8 3.2* 3.8  CL  --    < > 101 105 107 106 105  CO2  --    < > 33* 31 31 32 31  GLUCOSE  --    < > 127* 100* 132* 103* 121*  BUN  --    < > _0 CREATININE  --    < > 0.41* 0.37* 0.44 0.42* 0.39*  CALCIUM  --    < > 8.5* 8.6* 8.5* 8.6* 8.6*  MG 2.1  --   --  2.4  --   --  2.2  PHOS  --   --   --  4.1  --   --  3.2   < > = values in this interval not displayed.   GFR: Estimated Creatinine Clearance: 60.2 mL/min (A) (by C-G formula based on SCr of 0.39 mg/dL (L)). Liver Function Tests: Recent Labs  Lab 06/24/21 0156  AST 52*  ALT 57*  ALKPHOS 70  BILITOT 0.3  PROT 6.6  ALBUMIN 2.2*   No results for input(s): LIPASE,  AMYLASE in the last 168 hours. No results for input(s): AMMONIA in the last 168 hours. Coagulation Profile: No results for input(s): INR, PROTIME in the last 168 hours. Cardiac Enzymes: No results for input(s): CKTOTAL, CKMB, CKMBINDEX, TROPONINI in the last 168 hours. BNP (last 3 results) No results for input(s): PROBNP in the last 8760 hours. HbA1C: No results for input(s): HGBA1C in the last 72 hours. CBG: Recent Labs  Lab 06/26/21 0733 06/26/21 1128 06/30/21 0320  GLUCAP 120* 121* 131*   Lipid Profile: No results for input(s): CHOL, HDL, LDLCALC, TRIG, CHOLHDL, LDLDIRECT in the last 72 hours. Thyroid Function  Tests: No results for input(s): TSH, T4TOTAL, FREET4, T3FREE, THYROIDAB in the last 72 hours. Anemia Panel: No results for input(s): VITAMINB12, FOLATE, FERRITIN, TIBC, IRON, RETICCTPCT in the last 72 hours. Sepsis Labs: No results for input(s): PROCALCITON, LATICACIDVEN in the last 168 hours.  Recent Results (from the past 240 hour(s))  Culture, Respiratory w Gram Stain     Status: None   Collection Time: 06/23/21  2:16 AM   Specimen: Tracheal Aspirate; Respiratory  Result Value Ref Range Status   Specimen Description TRACHEAL ASPIRATE  Final   Special Requests NONE  Final   Gram Stain   Final    MODERATE WBC PRESENT,BOTH PMN AND MONONUCLEAR NO ORGANISMS SEEN    Culture   Final    FEW STAPHYLOCOCCUS AUREUS WITH IN NORMAL RESPIRATORY FLORA Performed at [facility], [place] [address], [place], [place] [address]    Report Status 06/25/2021 FINAL  Final   Organism ID, Bacteria STAPHYLOCOCCUS AUREUS  Final      Susceptibility   Staphylococcus aureus - MIC*    CIPROFLOXACIN <=0.5 SENSITIVE Sensitive     ERYTHROMYCIN <=0.25 SENSITIVE Sensitive     GENTAMICIN <=0.5 SENSITIVE Sensitive     OXACILLIN <=0.25 SENSITIVE Sensitive     TETRACYCLINE <=1 SENSITIVE Sensitive     VANCOMYCIN 1 SENSITIVE Sensitive     TRIMETH/SULFA <=10 SENSITIVE Sensitive      CLINDAMYCIN <=0.25 SENSITIVE Sensitive     RIFAMPIN <=0.5 SENSITIVE Sensitive     Inducible Clindamycin NEGATIVE Sensitive     * FEW STAPHYLOCOCCUS AUREUS    Radiology Studies: No results found.  Scheduled Meds:  vitamin C  500 mg Per Tube BID   calcium carbonate  1 tablet Per Tube TID   chlorhexidine gluconate (MEDLINE KIT)  15 mL Mouth Rinse BID   Chlorhexidine Gluconate Cloth  6 each Topical Daily   docusate  100 mg Per Tube BID   enoxaparin (LOVENOX) injection  30 mg Subcutaneous Q24H   feeding supplement (PROSource TF)  45 mL Per Tube BID   fiber  1 packet Per Tube Daily   free water  100 mL Per Tube Q4H   multivitamin with minerals  1 tablet Per Tube BID   pantoprazole sodium  40 mg Per Tube Daily   thiamine  100 mg Per Tube Daily   Continuous Infusions:  sodium chloride 10 mL/hr at 06/30/21 0200   sodium chloride     sodium chloride Stopped (06/21/21 1408)   feeding supplement (VITAL 1.5 CAL) 1,000 mL (06/29/21 1305)     LOS: 15 days    Time spent: 18mn  Shanigua Gibb C Eran Windish, DO [facility]  If 7PM-7AM, please contact night-coverage [email or web address]  06/30/2021, 7:37 AM

## 2021-06-30 NOTE — Progress Notes (Signed)
Brief Nutrition Note  Pt's weight continues to trend down. Reviewed pump settings. Over the last 24 hours, pt received ~1200 ml of tube feeding formula. With tube feeds infusing continuously at goal rate of 60 ml/hr, pt would have received 1440 ml of formula.  Will increase tube feeding rate to prevent additional weight loss.  Tube feeds via [employer]rak: - Vital 1.5 @ 65 ml/hr (1560 ml/day) - ProSource TF 45 ml BID - Free water flushes of 100 ml q 4 hours  Tube feeding regimen provides 2420 kcal, 127 grams of protein, and 1192 ml of H2O.   Total free water with flushes: 1792 ml   Mertie Clause, MS, RD, LDN Inpatient Clinical Dietitian Please see AMiON for contact information.

## 2021-06-30 NOTE — Progress Notes (Signed)
Palliative:  Ms. Singleterry is resting quietly in bed.  She appears acutely/chronically ill and frail, cachectic.  She does not respond in any meaningful way to voice or touch.  She will not look at me.  She is unable to make her basic needs known. There is no family at bedside at this time.   Call to first cousin/healthcare surrogate, Dimas Alexandria. Left somewhat detailed VM message offering support.   Return call from Tammy.  We talk about Vicky's acute health concerns.  Tammy shares that Lynden Ang went back to rehab in February of this year, and describes her struggles with addiction. She talks about what matters to [place], quality of life, and that Lynden Ang would not want to live in a diminished state. We talk about Vicky's response to me earlier today.  We talk about nutyrition and hydration.  Tammy states she would never accepts a feeding tube for Vicky.   Tammy tells me that if Lynden Ang is not showing "meaningful improvements" by beginning of next week, they would focus on comfort care.  We talk in detail about comfort care, residential hospice and the concept of "let nature take it's course".  I reassure Tammy that Lynden Ang would not suffer with pain at hospice.  Residential hospice facilities discussed.  Tammy requests [employer].  Tammy tells me, "Unless you call and tell me I need to be back sooner, I will be back Monday or Tuesday (18th or 19th).     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  States would like to know if there is a study for addicts brain and improving addiction outcomes. This team does not know of any such programs.  Tammy asks about cremation services, and several options offered/discussed.    Call to brother and sister in law to offer support and information  We talk about Vicky's acute health concerns.  Vonna Kotyk shares that he accepts that [place] and Tammy have ben close and that Babette Relic will make choices for his sister.  He tells me, "I know that Lynden Ang is not  coming back".  Vonna Kotyk shares that he knows Lynden Ang would never want long term feeding tube. He shares that he also knows that she would not want to live in LTC. Support offered.   Conference with attending and nursing staff related to patient condition, needs, GOC.   Plan:  Continue to treat the treatable, but no CPR or intubation. Limited time trial for outcomes, around 07/06/2021.   65 minutes, extended time   Lillia Carmel, NP Palliative Medicine Team  Team Phone [phone] Greater than 50% of this time was spent counseling and coordinating care related to the above assessment and plan.

## 2021-07-01 LAB — CBC
HCT: 37.4 % (ref 36.0–46.0)
Hemoglobin: 11.5 g/dL — ABNORMAL LOW (ref 12.0–15.0)
MCH: 28.4 pg (ref 26.0–34.0)
MCHC: 30.7 g/dL (ref 30.0–36.0)
MCV: 92.3 fL (ref 80.0–100.0)
Platelets: 561 10*3/uL — ABNORMAL HIGH (ref 150–400)
RBC: 4.05 MIL/uL (ref 3.87–5.11)
RDW: 18.5 % — ABNORMAL HIGH (ref 11.5–15.5)
WBC: 9.6 10*3/uL (ref 4.0–10.5)
nRBC: 0 % (ref 0.0–0.2)

## 2021-07-01 LAB — SULFONYLUREA HYPOGLYCEMICS PANEL, SERUM
Acetohexamide: NEGATIVE ug/mL (ref 20–60)
Chlorpropamide: NEGATIVE ug/mL (ref 75–250)
Glimepiride: NEGATIVE ng/mL (ref 80–250)
Glipizide: NEGATIVE ng/mL (ref 200–1000)
Glyburide: NEGATIVE ng/mL
Nateglinide: NEGATIVE ng/mL
Repaglinide: NEGATIVE ng/mL
Tolazamide: NEGATIVE ug/mL
Tolbutamide: NEGATIVE ug/mL (ref 40–100)

## 2021-07-01 LAB — BASIC METABOLIC PANEL
Anion gap: 6 (ref 5–15)
BUN: 16 mg/dL (ref 6–20)
CO2: 32 mmol/L (ref 22–32)
Calcium: 8.7 mg/dL — ABNORMAL LOW (ref 8.9–10.3)
Chloride: 104 mmol/L (ref 98–111)
Creatinine, Ser: 0.4 mg/dL — ABNORMAL LOW (ref 0.44–1.00)
GFR, Estimated: >60 mL/min (ref >60)
Glucose, Bld: 126 mg/dL — ABNORMAL HIGH (ref 70–99)
Potassium: 3.8 mmol/L (ref 3.5–5.1)
Sodium: 142 mmol/L (ref 135–145)

## 2021-07-01 LAB — GLUCOSE, CAPILLARY
Glucose-Capillary: 122 mg/dL — ABNORMAL HIGH (ref 70–99)
Glucose-Capillary: 104 mg/dL — ABNORMAL HIGH (ref 70–99)

## 2021-07-01 MED ORDER — HALOPERIDOL LACTATE 2 MG/ML PO CONC
0.5000 mg | ORAL | Status: DC | PRN
  Filled 2021-07-01: qty 0.3

## 2021-07-01 MED ORDER — GLYCOPYRROLATE 1 MG PO TABS: 1.0000 mg | ORAL_TABLET | ORAL | Status: DC | PRN

## 2021-07-01 MED ORDER — ACETAMINOPHEN 325 MG PO TABS: 650.0000 mg | ORAL_TABLET | Freq: Four times a day (QID) | ORAL | Status: DC | PRN

## 2021-07-01 MED ORDER — LORAZEPAM 2 MG/ML PO CONC: 1.0000 mg | ORAL | Status: DC | PRN

## 2021-07-01 MED ORDER — GLYCOPYRROLATE 0.2 MG/ML IJ SOLN: 0.2000 mg | INTRAMUSCULAR | Status: DC | PRN

## 2021-07-01 MED ORDER — POLYVINYL ALCOHOL 1.4 % OP SOLN
1.0000 [drp] | Freq: Four times a day (QID) | OPHTHALMIC | Status: DC | PRN
  Filled 2021-07-01: qty 15

## 2021-07-01 MED ORDER — ONDANSETRON 4 MG PO TBDP: 4.0000 mg | ORAL_TABLET | Freq: Four times a day (QID) | ORAL | Status: DC | PRN

## 2021-07-01 MED ORDER — LORAZEPAM 1 MG PO TABS
1.0000 mg | ORAL_TABLET | ORAL | Status: DC | PRN
  Administered 2021-07-02: 1 mg via ORAL
  Filled 2021-07-01: qty 1

## 2021-07-01 MED ORDER — BIOTENE DRY MOUTH MT LIQD: 15.0000 mL | OROMUCOSAL | Status: DC | PRN

## 2021-07-01 MED ORDER — HALOPERIDOL LACTATE 5 MG/ML IJ SOLN: 0.5000 mg | INTRAMUSCULAR | Status: DC | PRN

## 2021-07-01 MED ORDER — ACETAMINOPHEN 650 MG RE SUPP: 650.0000 mg | Freq: Four times a day (QID) | RECTAL | Status: DC | PRN

## 2021-07-01 MED ORDER — HALOPERIDOL 0.5 MG PO TABS: 0.5000 mg | ORAL_TABLET | ORAL | Status: DC | PRN

## 2021-07-01 MED ORDER — BISACODYL 10 MG RE SUPP: 10.0000 mg | Freq: Every day | RECTAL | Status: DC | PRN

## 2021-07-01 MED ORDER — LORAZEPAM 2 MG/ML IJ SOLN: 1.0000 mg | INTRAMUSCULAR | Status: DC | PRN

## 2021-07-01 MED ORDER — ONDANSETRON HCL 4 MG/2ML IJ SOLN: 4.0000 mg | Freq: Four times a day (QID) | INTRAMUSCULAR | Status: DC | PRN

## 2021-07-01 NOTE — Progress Notes (Signed)
Palliative:  Sonia Howard is lying quietly in bed. She has not had any meaningful improvements.    Call from [facility]. She tells me that she was able to find a handwritten paper that listed her end of life wishes. Sonia Howard shares that she is ready for transition to hospice care.  Sonia Howard shares her love and concern for her cousin Sonia Howard.  She shares that she knows Sonia Howard is ready for hospice care.  We talked about what is and is not provided at hospice care.  I reassured Sonia Howard that residential hospice care is a loving choice.  Comfort care orders implemented.  Conference with attending, bedside nursing staff related to patient condition, needs, GOC, request for residential hospice  Plan:  Sonia Howard is requesting initiation of residential hospice referral today.  Provider choice offered, [facility] for residential hospice.  Prognosis:  1 to 2 weeks or less would be expected based on acute illness, inability to safely take by mouth nutrition, patient's desire for no life prolonging treatments, healthcare surrogate's desire to focus on comfort and dignity, let nature take its course.  40 minutes  Lillia Carmel, NP Palliative Medicine Team  Team Phone [phone] Greater than 50% of this time was spent counseling and coordinating care related to the above assessment and plan.

## 2021-07-01 NOTE — Progress Notes (Signed)
PROGRESS NOTE    Sonia Howard  IRJ:[id number] DOB: 1981/03/10 DOA: 06/15/2021 PCP: No primary care provider on file.   Brief Narrative:  40 year old female with history of drug overdose and unintentional weight loss who was brought into the emergency department after she was found unresponsive at her friend's house, intubated due to her GCS score being 3, with profound hypoglycemia - did not respond to narcan.   Collateral history per patient's brother: patient has a history of opioid use disorder, specifically fentanyl. Her father passed away in Feb 22, 2023, and per brother she was found trying to remove some of the fentanyl from her father's port line. She went to [facility] for rehab for 6 weeks in the beginning of April and was put on suboxone.  She is legally married to Sonia Howard but have been separated around a year. Per brother, he does not want to have anything to do with her. Patient was living with her friend Sonia Howard, and Sonia Howard's mom who also have a history of IVDU. Patient was found unresponsive by Sonia Howard's mom who called EMS.  Lenghty hospital stay - now extubated but making minimal recovery - family and HCPOA discussed with care team, patient would not want to be place back on vent nor would she want a permanent feeding tube. Will pursue supportive care - if no improvement by early next week (July 25-26th) would transition to hospice per discussion with palliative care.  7/6 admitted and intubated 7/7 ceftriaxone started 7/10 remains intubated, MRI, EEG neg, slowly improving neuro exam, off sedation 7/12 ceftriaxone finished 7/14 self-extubated (ETT anchor fell?) and re-intubated Resp cx 7/14 >> MSSA Vanco x 1 day 7/15; changed to cefazolin 7/16 7/19 extubated, cefazolin finished  7/20 - ongoing supportive care; minimal improvement  Assessment & Plan:   Active Problems:   Acute encephalopathy   Pressure injury of skin   Protein-calorie malnutrition, severe    Acute respiratory failure with hypoxia (HCC)   Acute on chronic respiratory failure with hypoxia (HCC)   Goals of care, counseling/discussion   Palliative care by specialist   Coma/Encephalopathy, presumed drug overdose with anoxic brain injury vs hypoglycemic coma UDS negative; hypoglycemic but did not improve with glucose CT head neg. MRI unrevealing. EEG no seizure. Repeat MRI, CTA head unremarkable. Extubated 7/19, family agreed to not re-intubate - no permanent feeding tube Continue supportive care - palliative following for possible hospice next week if no meaningful recover   Acute hypoxemic respiratory failure requiring intubation and mechanical ventilation, POA Resolving Continue supplemental oxygen - no plans to escalate Currently on 5L [place] - will continue to wean as tolerated   Sever malnutritional status Failure to thrive Scurvy:  Severely malnourished and history of gastric sleeve surgery.  VitC <0.1 essentially undetectable - continue to replete along with other multivitamin/thiamine Continue tube feeds as tolerated - vital 1.5 continuous   DVT prophylaxis: lovenox Code Status: DNR Family Communication: Updated previously  Status is: Inpt  Dispo: The patient is from: Home              Anticipated d/c is to: TBD              Anticipated d/c date is: 72-96h              Patient currently NOT medically stable for discharge  Consultants:  PCCM, Neuro  Antimicrobials:  Zosyn --> Ceftriaxone --> Cefazolin; completed   Subjective: No acute issues/events overnight - ROS severely limited  Objective: Vitals:   07/01/21  1000 07/01/21 1030 07/01/21 1035 07/01/21 1125  BP: 97/64 (!) 94/53    Pulse: 84  87   Resp: 11  17   Temp:    97.6 F (36.4 C)  TempSrc:    Oral  SpO2: 92%  90%   Weight:      Height:        Intake/Output Summary (Last 24 hours) at 07/01/2021 1208 Last data filed at 07/01/2021 0700 Gross per 24 hour  Intake 1772.2 ml  Output 1675 ml  Net  97.2 ml    Filed Weights   06/29/21 0500 06/30/21 0345 07/01/21 0451  Weight: 41.9 kg 41.2 kg 40 kg    Examination:  General exam: Cachectic, somnolent and minimally arousable; blinks to threat but otherwise minimally responsive, protecting airway Respiratory system: Clear to auscultation. Respiratory effort normal. Cardiovascular system: S1 & S2 heard, RRR. No JVD, murmurs, rubs, gallops or clicks. No pedal edema. Gastrointestinal system: Abdomen is nondistended, soft and nontender. No organomegaly or masses felt. Normal bowel sounds heard. Central nervous system: Somnolent but arousable - blinks to threat, minimally interactive otherwise - does move all 4 extremities without overt deficits.. Extremities: Symmetric 5 x 5 power. Skin: No rashes, lesions or ulcers Psychiatry: Judgement and insight appear normal. Mood & affect appropriate.   Data Reviewed: I have personally reviewed following labs and imaging studies  CBC: Recent Labs  Lab 06/26/21 0210 06/27/21 0318 06/28/21 0105 06/29/21 0109 07/01/21 0432  WBC 12.0* 11.6* 10.9* 9.5 9.6  HGB 10.2* 9.9* 10.1* 10.8* 11.5*  HCT 32.6* 32.1* 33.3* 35.6* 37.4  MCV 90.3 90.7 91.7 92.7 92.3  PLT 448* 323 552* 567* 561*    Basic Metabolic Panel: Recent Labs  Lab 06/27/21 0318 06/28/21 0105 06/29/21 0109 06/30/21 0225 07/01/21 0432  NA 143 144 144 142 142  K 3.4* 3.8 3.2* 3.8 3.8  CL 105 107 106 105 104  CO2 31 31 32 31 32  GLUCOSE 100* 132* 103* 121* 126*  BUN _0 CREATININE 0.37* 0.44 0.42* 0.39* 0.40*  CALCIUM 8.6* 8.5* 8.6* 8.6* 8.7*  MG 2.4  --   --  2.2  --   PHOS 4.1  --   --  3.2  --     GFR: Estimated Creatinine Clearance: 58.4 mL/min (A) (by C-G formula based on SCr of 0.4 mg/dL (L)). Liver Function Tests: No results for input(s): AST, ALT, ALKPHOS, BILITOT, PROT, ALBUMIN in the last 168 hours.  No results for input(s): LIPASE, AMYLASE in the last 168 hours. No results for input(s): AMMONIA in  the last 168 hours. Coagulation Profile: No results for input(s): INR, PROTIME in the last 168 hours. Cardiac Enzymes: No results for input(s): CKTOTAL, CKMB, CKMBINDEX, TROPONINI in the last 168 hours. BNP (last 3 results) No results for input(s): PROBNP in the last 8760 hours. HbA1C: No results for input(s): HGBA1C in the last 72 hours. CBG: Recent Labs  Lab 06/26/21 0733 06/26/21 1128 06/30/21 0320 07/01/21 0711 07/01/21 1111  GLUCAP 120* 121* 131* 122* 104*    Lipid Profile: No results for input(s): CHOL, HDL, LDLCALC, TRIG, CHOLHDL, LDLDIRECT in the last 72 hours. Thyroid Function Tests: No results for input(s): TSH, T4TOTAL, FREET4, T3FREE, THYROIDAB in the last 72 hours. Anemia Panel: No results for input(s): VITAMINB12, FOLATE, FERRITIN, TIBC, IRON, RETICCTPCT in the last 72 hours. Sepsis Labs: No results for input(s): PROCALCITON, LATICACIDVEN in the last 168 hours.  Recent Results (from the past 240 hour(s))  Culture,  Respiratory w Gram Stain     Status: None   Collection Time: 06/23/21  2:16 AM   Specimen: Tracheal Aspirate; Respiratory  Result Value Ref Range Status   Specimen Description TRACHEAL ASPIRATE  Final   Special Requests NONE  Final   Gram Stain   Final    MODERATE WBC PRESENT,BOTH PMN AND MONONUCLEAR NO ORGANISMS SEEN    Culture   Final    FEW STAPHYLOCOCCUS AUREUS WITH IN NORMAL RESPIRATORY FLORA Performed at [facility], [place] [address], [place], [place] [address]    Report Status 06/25/2021 FINAL  Final   Organism ID, Bacteria STAPHYLOCOCCUS AUREUS  Final      Susceptibility   Staphylococcus aureus - MIC*    CIPROFLOXACIN <=0.5 SENSITIVE Sensitive     ERYTHROMYCIN <=0.25 SENSITIVE Sensitive     GENTAMICIN <=0.5 SENSITIVE Sensitive     OXACILLIN <=0.25 SENSITIVE Sensitive     TETRACYCLINE <=1 SENSITIVE Sensitive     VANCOMYCIN 1 SENSITIVE Sensitive     TRIMETH/SULFA <=10 SENSITIVE Sensitive     CLINDAMYCIN <=0.25 SENSITIVE  Sensitive     RIFAMPIN <=0.5 SENSITIVE Sensitive     Inducible Clindamycin NEGATIVE Sensitive     * FEW STAPHYLOCOCCUS AUREUS     Radiology Studies: No results found.  Scheduled Meds:  vitamin C  500 mg Per Tube BID   calcium carbonate  1 tablet Per Tube TID   chlorhexidine gluconate (MEDLINE KIT)  15 mL Mouth Rinse BID   Chlorhexidine Gluconate Cloth  6 each Topical Daily   docusate  100 mg Per Tube BID   enoxaparin (LOVENOX) injection  30 mg Subcutaneous Q24H   feeding supplement (PROSource TF)  45 mL Per Tube BID   free water  100 mL Per Tube Q4H   multivitamin with minerals  1 tablet Per Tube BID   pantoprazole sodium  40 mg Per Tube Daily   thiamine  100 mg Per Tube Daily   Continuous Infusions:  sodium chloride Stopped (06/30/21 0941)   sodium chloride     sodium chloride Stopped (06/21/21 1408)   feeding supplement (VITAL 1.5 CAL) 65 mL/hr at 06/30/21 1900     LOS: 16 days    Time spent: 77mn  Veronia Laprise C Tamarick Kovalcik, DO [facility]  If 7PM-7AM, please contact night-coverage [email or web address]  07/01/2021, 12:08 PM

## 2021-07-01 NOTE — TOC Progression Note (Signed)
Transition of Care [facility]) - Progression Note    Patient Details  Name: Sonia Howard MRN: [id number] Date of Birth: May 30, 1981  Transition of Care [facility]) CM/SW Contact  Erin Sons, [place] Phone Number: 07/01/2021, 3:03 PM  Clinical Narrative:     Notified by palliative that plan is for Hospice facility with [facility] in [place]. CSW called Tammy who confirmed. She explains she sometimes does not have service and asks social workers to leave message if she is not available.   CSW contacted [facility] liaison and made referral for hospice facility in [place].    Expected Discharge Plan: Hospice Medical Facility Barriers to Discharge: No Barriers Identified  Expected Discharge Plan and Services Expected Discharge Plan: Hospice Medical Facility In-house Referral: Clinical Social Work, Hospice / Palliative Care Discharge Planning Services: CM Consult   Living arrangements for the past 2 months: Single Family Home                                       Social Determinants of Health (SDOH) Interventions    Readmission Risk Interventions No flowsheet data found.

## 2021-07-01 NOTE — Progress Notes (Signed)
Civil engineer, contracting [facility]) Hospital Liaison note.     Received request from [facility] manager for family interest in Hospice Home.  Patient information has been forwarded to [facility] for review.  Liaison spoke with Dimas Alexandria, HCPOA to explain services and answer questions.   [facility] liaison will follow up once eligibility and availability have been determined.   A Please do not hesitate to call with questions.   Thank you,   Elsie Saas, RN, [facility]      [facility] Liaison  [phone]

## 2021-07-02 DIAGNOSIS — E43 Unspecified severe protein-calorie malnutrition: Secondary | ICD-10-CM

## 2021-07-02 NOTE — Progress Notes (Signed)
Palliative:  [name], Sonia Howard, is lying quietly in bed.  She does not interact with me or acknowledge my presence in any way.  She is unable to take food and drink, unable to make her basic needs known.  There is no family at bedside at this time.    Sonia Howard has not shown meaningful improvements and her healthcare surrogate/first cousin, Dimas Alexandria, has elected comfort and dignity, let nature take its course with residential hospice, [employer].   Referral has been made, awaiting approval and bed availability.  Conference with bedside nursing staff related to patient condition, needs, goals of care, disposition. End-of-life order set in place.  Plan: Focus on comfort and dignity at end-of-life, let nature take its course at residential hospice, [facility]  20 minute  Lillia Carmel, NP  Palliative medicine team Team phone [phone] Greater than 50% of this time was spent counseling and coordinating care related to the above assessment and plan.

## 2021-07-02 NOTE — Progress Notes (Addendum)
Civil engineer, contracting [facility]) Hospital Liaison note.    Chart reviewed and eligibility confirmed for Hospice Home in [place]. Hospice Home social worker is reaching out to family to complete consents with a plan to transfer  in the morning if consents can be completed this afternoon.  ACC will notify TOC when registration paperwork has been completed.  Bed will be available in the morning.  Thank you for the opportunity to participate in this patient's care.  Gillian Scarce, BSN, RN [facility] Liaison (listed on [place] under Hospice/[facility])    [phone] (24h on call)

## 2021-07-02 NOTE — Progress Notes (Addendum)
PROGRESS NOTE  Sonia Howard NLZ:[id number] DOB: Aug 18, 1981 DOA: 06/15/2021 PCP: No primary care provider on file.  HPI/Recap of past 60 hours: 40 year old female with history of drug overdose unintentional weight loss who was brought into the emergency department when she was found unresponsive at a friend's house.  She was intubated due to [facility] Coma Scale score of 3 with profound hypoglycemia she had not responded to Narcan  7/6 admitted and intubated 7/7 ceftriaxone started 7/10 remains intubated, MRI, EEG neg, slowly improving neuro exam, off sedation 7/12 ceftriaxone finished 7/14 self-extubated (ETT anchor fell?) and re-intubated Resp cx 7/14 >> MSSA Vanco x 1 day 7/15; changed to cefazolin 7/16 7/19 extubated, cefazolin finished  7/20 - ongoing supportive care; minimal improvement  Patient seen and examined at bedside she is awake and moving but not interactive.  She is making involuntary movements  Assessment/Plan: Active Problems:   Acute encephalopathy   Pressure injury of skin   Protein-calorie malnutrition, severe   Acute respiratory failure with hypoxia (HCC)   Acute on chronic respiratory failure with hypoxia (HCC)   Goals of care, counseling/discussion   Palliative care by specialist  #1 encephalopathy presumed drug overdose with anoxic brain injury versus hypoglycemic, CT scan of the head negative MRI unrevealing EEG no seizure Repeat MRI head CTA all unremarkable Palliative care has been involved and they have agreed to hospice.  Patient has been accepted and likely be transferred to hospice in the morning Continue supportive care  2.  Acute hypoxemic respiratory failure requiring intubation and mechanical ventilation Prior to admission Resolving currently supplemental oxygen  3.  Severe malnutrition Failure to thrive With SCURVY vitamin C was less than 0.1 Patient has a history of gastric sleeve surgery Continue tube feeding as tolerated  Code  Status: DNR  Severity of Illness: The appropriate patient status for this patient is INPATIENT. Inpatient status is judged to be reasonable and necessary in order to provide the required intensity of service to ensure the patient's safety. The patient's presenting symptoms, physical exam findings, and initial radiographic and laboratory data in the context of their chronic comorbidities is felt to place them at high risk for further clinical deterioration. Furthermore, it is not anticipated that the patient will be medically stable for discharge from the hospital within 2 midnights of admission. The following factors support the patient status of inpatient.   " Patient is now hospice and awaiting hospice transfer   * I certify that at the point of admission it is my clinical judgment that the patient will require inpatient hospital care spanning beyond 2 midnights from the point of admission due to high intensity of service, high risk for further deterioration and high frequency of surveillance required.*   Family Communication: None at bedside  Disposition Plan: Possible discharge to hospice in the morning pending approval and consent by family   Consultants: Palliative care PCCM Neuro  Procedures:    Antimicrobials: Zosyn then ceftriaxone and cefazolin completed  DVT prophylaxis: Lovenox   Objective: Vitals:   07/02/21 0600 07/02/21 0630 07/02/21 0700 07/02/21 0800  BP:      Pulse: 68 73 70 75  Resp: (!) 7 11 (!) 9 (!) 6  Temp:      TempSrc:      SpO2: 98% 100% 98% 100%  Weight:      Height:        Intake/Output Summary (Last 24 hours) at 07/02/2021 0834 Last data filed at 07/02/2021 0200 Gross per 24 hour  Intake --  Output 900 ml  Net -900 ml   Filed Weights   06/29/21 0500 06/30/21 0345 07/01/21 0451  Weight: 41.9 kg 41.2 kg 40 kg   Body mass index is 13.41 kg/m.  Exam:  General: 40 y.o. year-old female well developed poorly nourished cachectic in no  acute distress.  Alert eyes are wide open she was able to lift her head off the pillow but was not tracking was not focused Cardiovascular: Regular rate and rhythm with no rubs or gallops.  No thyromegaly or JVD noted.   Respiratory: Clear to auscultation with no wheezes or rales. Good inspiratory effort. Abdomen: Soft nontender nondistended with normal bowel sounds x4 quadrants. Musculoskeletal: No lower extremity edema. 2/4 pulses in all 4 extremities. Skin: No ulcerative lesions noted or rashes, Neuro/psychiatry: Awake but not interactive.  Not tracking, does not acknowledge presence    Data Reviewed: CBC: Recent Labs  Lab 06/26/21 0210 06/27/21 0318 06/28/21 0105 06/29/21 0109 07/01/21 0432  WBC 12.0* 11.6* 10.9* 9.5 9.6  HGB 10.2* 9.9* 10.1* 10.8* 11.5*  HCT 32.6* 32.1* 33.3* 35.6* 37.4  MCV 90.3 90.7 91.7 92.7 92.3  PLT 448* 323 552* 567* 561*   Basic Metabolic Panel: Recent Labs  Lab 06/27/21 0318 06/28/21 0105 06/29/21 0109 06/30/21 0225 07/01/21 0432  NA 143 144 144 142 142  K 3.4* 3.8 3.2* 3.8 3.8  CL 105 107 106 105 104  CO2 31 31 32 31 32  GLUCOSE 100* 132* 103* 121* 126*  BUN 16 16 19 20 16   CREATININE 0.37* 0.44 0.42* 0.39* 0.40*  CALCIUM 8.6* 8.5* 8.6* 8.6* 8.7*  MG 2.4  --   --  2.2  --   PHOS 4.1  --   --  3.2  --    GFR: Estimated Creatinine Clearance: 58.4 mL/min (A) (by C-G formula based on SCr of 0.4 mg/dL (L)). Liver Function Tests: No results for input(s): AST, ALT, ALKPHOS, BILITOT, PROT, ALBUMIN in the last 168 hours. No results for input(s): LIPASE, AMYLASE in the last 168 hours. No results for input(s): AMMONIA in the last 168 hours. Coagulation Profile: No results for input(s): INR, PROTIME in the last 168 hours. Cardiac Enzymes: No results for input(s): CKTOTAL, CKMB, CKMBINDEX, TROPONINI in the last 168 hours. BNP (last 3 results) No results for input(s): PROBNP in the last 8760 hours. HbA1C: No results for input(s): HGBA1C in the  last 72 hours. CBG: Recent Labs  Lab 06/26/21 0733 06/26/21 1128 06/30/21 0320 07/01/21 0711 07/01/21 1111  GLUCAP 120* 121* 131* 122* 104*   Lipid Profile: No results for input(s): CHOL, HDL, LDLCALC, TRIG, CHOLHDL, LDLDIRECT in the last 72 hours. Thyroid Function Tests: No results for input(s): TSH, T4TOTAL, FREET4, T3FREE, THYROIDAB in the last 72 hours. Anemia Panel: No results for input(s): VITAMINB12, FOLATE, FERRITIN, TIBC, IRON, RETICCTPCT in the last 72 hours. Urine analysis:    Component Value Date/Time   COLORURINE YELLOW 06/15/2021 1536   APPEARANCEUR CLEAR 06/15/2021 1536   LABSPEC 1.016 06/15/2021 1536   PHURINE 5.0 06/15/2021 1536   GLUCOSEU 150 (A) 06/15/2021 1536   HGBUR SMALL (A) 06/15/2021 1536   BILIRUBINUR NEGATIVE 06/15/2021 1536   KETONESUR NEGATIVE 06/15/2021 1536   PROTEINUR 30 (A) 06/15/2021 1536   NITRITE NEGATIVE 06/15/2021 1536   LEUKOCYTESUR NEGATIVE 06/15/2021 1536   Sepsis Labs: @LABRCNTIP (procalcitonin:4,lacticidven:4)  ) Recent Results (from the past 240 hour(s))  Culture, Respiratory w Gram Stain     Status: None   Collection Time: 06/23/21  2:16 AM   Specimen: Tracheal Aspirate; Respiratory  Result Value Ref Range Status   Specimen Description TRACHEAL ASPIRATE  Final   Special Requests NONE  Final   Gram Stain   Final    MODERATE WBC PRESENT,BOTH PMN AND MONONUCLEAR NO ORGANISMS SEEN    Culture   Final    FEW STAPHYLOCOCCUS AUREUS WITH IN NORMAL RESPIRATORY FLORA Performed at [facility], [address] [place], [place] [address]    Report Status 06/25/2021 FINAL  Final   Organism ID, Bacteria STAPHYLOCOCCUS AUREUS  Final      Susceptibility   Staphylococcus aureus - MIC*    CIPROFLOXACIN <=0.5 SENSITIVE Sensitive     ERYTHROMYCIN <=0.25 SENSITIVE Sensitive     GENTAMICIN <=0.5 SENSITIVE Sensitive     OXACILLIN <=0.25 SENSITIVE Sensitive     TETRACYCLINE <=1 SENSITIVE Sensitive     VANCOMYCIN 1 SENSITIVE  Sensitive     TRIMETH/SULFA <=10 SENSITIVE Sensitive     CLINDAMYCIN <=0.25 SENSITIVE Sensitive     RIFAMPIN <=0.5 SENSITIVE Sensitive     Inducible Clindamycin NEGATIVE Sensitive     * FEW STAPHYLOCOCCUS AUREUS      Studies: No results found.  Scheduled Meds:  Continuous Infusions:  sodium chloride Stopped (06/21/21 1408)     LOS: 17 days     Myrtie Neither, MD [facility]  To reach me or the doctor on call, go to: [email or web address] Password [facility]  07/02/2021, 8:34 AM

## 2021-07-03 MED ORDER — ACETAMINOPHEN 325 MG PO TABS: 650.0000 mg | ORAL_TABLET | Freq: Four times a day (QID) | ORAL | 0 refills | Status: AC | PRN

## 2021-07-03 MED ORDER — BISACODYL 10 MG RE SUPP: 10.0000 mg | Freq: Every day | RECTAL | 0 refills | Status: AC | PRN

## 2021-07-03 MED ORDER — LORAZEPAM 2 MG/ML PO CONC: 1.0000 mg | ORAL | 0 refills | Status: AC | PRN

## 2021-07-03 NOTE — Discharge Summary (Signed)
Discharge Summary  Sonia Howard:[id number] DOB: August 05, 1981  PCP: No primary care provider on file.  Admit date: 06/15/2021 Discharge date: 07/03/2021  Time spent: 31 minutes  Recommendations for Outpatient Follow-up:  Hospice  Discharge Diagnoses:  Active Hospital Problems   Diagnosis Date Noted   Acute on chronic respiratory failure with hypoxia (HCC)    Goals of care, counseling/discussion    Palliative care by specialist    Acute respiratory failure with hypoxia (HCC)    Protein-calorie malnutrition, severe 06/17/2021   Pressure injury of skin 06/16/2021   Acute encephalopathy 06/15/2021    Resolved Hospital Problems  No resolved problems to display.    Discharge Condition: Unchanged  Diet recommendation: N.p.o. except ice chips  Vitals:   07/03/21 0500 07/03/21 0600  BP:    Pulse: 73 76  Resp: 14 16  Temp:    SpO2: 90% 91%    History of present illness:  40 year old female with history of drug overdose unintentional weight loss who was brought into the emergency department when she was found unresponsive at a friend's house.  She was intubated due to [facility] Coma Scale score of 3 with profound hypoglycemia she had not responded to Narcan    Hospital Course:  Active Problems:   Acute encephalopathy   Pressure injury of skin   Protein-calorie malnutrition, severe   Acute respiratory failure with hypoxia (HCC)   Acute on chronic respiratory failure with hypoxia (HCC)   Goals of care, counseling/discussion   Palliative care by specialist   #1 encephalopathy presumed drug overdose with anoxic brain injury versus hypoglycemic, CT scan of the head negative MRI unrevealing EEG no seizure Repeat MRI head CTA all unremarkable  2.  Acute hypoxemic respiratory failure requiring intubation and mechanical ventilation Prior to admission Resolved patient is currently on room air saturating 93 to 95%  3.  Severe malnutrition Failure to thrive Patient has a  history of gastric sleeve surgery Patient is currently n.p.o. with only ice chips due to comfort care  Procedures: 7/6 admitted and intubated 7/7 ceftriaxone started 7/10 remains intubated, MRI, EEG neg, slowly improving neuro exam, off sedation 7/12 ceftriaxone finished 7/14 self-extubated (ETT anchor fell?) and re-intubated Resp cx 7/14 >> MSSA Vanco x 1 day 7/15; changed to cefazolin 7/16 7/19 extubated, cefazolin finished    Consultations: Palliative care PCCM Neuro   Code Status: DNR/comfort care   Discharge Exam: BP 108/65   Pulse 76   Temp 97.7 F (36.5 C) (Oral)   Resp 16   Ht  (1.727 m)   Wt 40 kg   LMP  (LMP Unknown) Comment: Pt intubated at the time of x-ray. NEG preg test on 07/06  SpO2 91%   BMI 13.41 kg/m   General: 40 y.o. year-old female well developed poorly nourished cachectic in no acute distress.  Alert eyes are wide open she was able to lift her head off the pillow but was not tracking was not focused Cardiovascular: Regular rate and rhythm with no rubs or gallops.  No thyromegaly or JVD noted.   Respiratory: Clear to auscultation with no wheezes or rales. Good inspiratory effort. Abdomen: Soft nontender nondistended with normal bowel sounds x4 quadrants. Musculoskeletal: No lower extremity edema. 2/4 pulses in all 4 extremities. Skin: No ulcerative lesions noted or rashes, Neuro/psychiatry: Awake but not interactive.  Not tracking, does not acknowledge presence  Discharge Instructions You were cared for by a hospitalist during your hospital stay. If you have any questions about your discharge  medications or the care you received while you were in the hospital after you are discharged, you can call the unit and asked to speak with the hospitalist on call if the hospitalist that took care of you is not available. Once you are discharged, your primary care physician will handle any further medical issues. Please note that NO REFILLS for any  discharge medications will be authorized once you are discharged, as it is imperative that you return to your primary care physician (or establish a relationship with a primary care physician if you do not have one) for your aftercare needs so that they can reassess your need for medications and monitor your lab values.  Discharge Instructions     Diet clear liquid   Complete by: As directed    Npo except ice chips   Discharge instructions   Complete by: As directed    Discharge to hospice.  Patient is comfort care   No dressing needed   Complete by: As directed       Allergies as of 07/03/2021   Not on File      Medication List     TAKE these medications    acetaminophen 325 MG tablet Commonly known as: TYLENOL Take 2 tablets (650 mg total) by mouth every 6 (six) hours as needed for mild pain (or Fever >/= 101).   bisacodyl 10 MG suppository Commonly known as: DULCOLAX Place 1 suppository (10 mg total) rectally daily as needed for moderate constipation.   LORazepam 2 MG/ML concentrated solution Commonly known as: ATIVAN Place 0.5 mLs (1 mg total) under the tongue every 4 (four) hours as needed for anxiety.               Discharge Care Instructions  (From admission, onward)           Start     Ordered   07/03/21 0000  No dressing needed        07/03/21 1237           Not on File    The results of significant diagnostics from this hospitalization (including imaging, microbiology, ancillary and laboratory) are listed below for reference.    Significant Diagnostic Studies: CT ANGIO HEAD W OR WO CONTRAST  Result Date: 06/20/2021 CLINICAL DATA:  Initial evaluation for possible subarachnoid hemorrhage on recent MRI. EXAM: CT ANGIOGRAPHY HEAD TECHNIQUE: Multidetector CT imaging of the head was performed using the standard protocol during bolus administration of intravenous contrast. Multiplanar CT image reconstructions and MIPs were obtained to evaluate  the vascular anatomy. CONTRAST:  27mL OMNIPAQUE IOHEXOL 350 MG/ML SOLN COMPARISON:  Prior MRI from 06/19/2021 and 06/16/2021. FINDINGS: CT HEAD Brain: Mild generalized cerebral atrophy for age. No acute subarachnoid or other intracranial hemorrhage seen to correspond with previously question susceptible artifact on prior MRI. This was likely artifactual. No acute large vessel territory infarct. No mass lesion, midline shift or mass effect. No hydrocephalus or extra-axial fluid collection. Vascular: No hyperdense vessel. Skull: EEG leads present at the scalp.  Calvarium intact. Sinuses: Nasogastric tube partially visualized. Mild scattered mucosal thickening noted within the visualized sphenoid ethmoidal sinuses. No mastoid effusion. Other: Visualized globes and orbital soft tissues within normal limits. CTA HEAD Anterior circulation: Both internal carotid arteries widely patent to the termini without stenosis. A1 segments widely patent. Normal anterior communicating artery complex. Both anterior cerebral arteries widely patent to their distal aspects without stenosis. No M1 stenosis or occlusion. Normal MCA bifurcations. Distal MCA branches well perfused and  symmetric. Posterior circulation: Both V4 segments patent to the vertebrobasilar junction without stenosis. Both PICA origins patent and normal. Basilar widely patent to its distal aspect without stenosis. Superior cerebellar arteries patent bilaterally. Both PCAs primarily supplied via the basilar and are well perfused to there distal aspects. Venous sinuses: Grossly patent allowing for timing the contrast bolus. A CT venogram was not performed. No obvious dural sinus thrombosis or cortical vein thrombosis. Anatomic variants: None significant.  No aneurysm. Review of the MIP images confirms the above findings. IMPRESSION: CT HEAD IMPRESSION: No acute intracranial abnormality. Specifically, no convexity subarachnoid hemorrhage seen to correspond with questioned  susceptibility artifact on prior MRI. This was likely artifactual. CTA HEAD IMPRESSION: 1. Normal CTA of the head. No large vessel occlusion, hemodynamically significant stenosis, or other acute vascular abnormality. 2. Please note that a CT venogram was not performed. Electronically Signed   By: Rise Mu M.D.   On: 06/20/2021 03:33   DG Chest 1 View  Result Date: 06/16/2021 CLINICAL DATA:  Central line placement EXAM: CHEST  1 VIEW COMPARISON:  Earlier same day FINDINGS: New left IJ central line tip overlies the superior right atrium. Enteric tube passes into the stomach with tip out of field of view. Endotracheal tube is unchanged. Persistent bilateral opacities with relative sparing of the periphery. Aeration is similar. No significant pleural effusion. No pneumothorax. Stable cardiomediastinal contours. IMPRESSION: New right IJ line tip overlies superior right atrium. No pneumothorax. Similar lung aeration with persistent bilateral opacities. Electronically Signed   By: Guadlupe Spanish M.D.   On: 06/16/2021 09:03   DG Abd 1 View  Result Date: 06/18/2021 CLINICAL DATA:  OG tube placement EXAM: ABDOMEN - 1 VIEW COMPARISON:  06/17/2021 FINDINGS: Feeding tube is in place in the descending duodenum. NG tube tip is in the mid stomach. Nonobstructive bowel gas pattern. No organomegaly or free air. IMPRESSION: Feeding tube in the descending duodenum and NG tube in the stomach. Electronically Signed   By: Charlett Nose M.D.   On: 06/18/2021 12:25   CT Head Wo Contrast  Result Date: 06/15/2021 CLINICAL DATA:  Found on the ground.  Intoxicated. EXAM: CT HEAD WITHOUT CONTRAST CT MAXILLOFACIAL WITHOUT CONTRAST CT CERVICAL SPINE WITHOUT CONTRAST TECHNIQUE: Multidetector CT imaging of the head, cervical spine, and maxillofacial structures were performed using the standard protocol without intravenous contrast. Multiplanar CT image reconstructions of the cervical spine and maxillofacial structures were also  generated. COMPARISON:  None. FINDINGS: CT HEAD FINDINGS Brain: The brain shows a normal appearance without evidence of malformation, atrophy, old or acute small or large vessel infarction, mass lesion, hemorrhage, hydrocephalus or extra-axial collection. Vascular: No hyperdense vessel. No evidence of atherosclerotic calcification. Skull: Normal.  No traumatic finding.  No focal bone lesion. Sinuses/Orbits: Sinuses are clear. Orbits appear normal. Mastoids are clear. Other: None significant CT MAXILLOFACIAL FINDINGS Osseous: No evidence of facial fracture. Orbits: No orbital injury. Sinuses: Some mucosal thickening and fluid of the right maxillary sinus. Other sinuses are clear. Soft tissues: No soft tissue face abnormality of significance. Endotracheal tube and orogastric tube in place. CT CERVICAL SPINE FINDINGS Alignment: No malalignment. Skull base and vertebrae: No fracture or focal bone lesion. Soft tissues and spinal canal: No soft tissue neck lesion identified. Endotracheal tube and orogastric tube in place. Disc levels: No degenerative disc disease. No bony stenosis of the canal or foramina. Upper chest: Septal thickening in the upper lungs could reflect early edema. Other: None IMPRESSION: Head CT: Normal Maxillofacial CT: No traumatic  finding. Some mucosal inflammatory disease of the right maxillary sinus. Cervical spine CT: No traumatic finding.  Normal. Endotracheal tube and orogastric tube in place. Septal thickening in the upper lungs consistent with pulmonary edema. Electronically Signed   By: Paulina Fusi M.D.   On: 06/15/2021 15:35   CT Cervical Spine Wo Contrast  Result Date: 06/15/2021 CLINICAL DATA:  Found on the ground.  Intoxicated. EXAM: CT HEAD WITHOUT CONTRAST CT MAXILLOFACIAL WITHOUT CONTRAST CT CERVICAL SPINE WITHOUT CONTRAST TECHNIQUE: Multidetector CT imaging of the head, cervical spine, and maxillofacial structures were performed using the standard protocol without intravenous  contrast. Multiplanar CT image reconstructions of the cervical spine and maxillofacial structures were also generated. COMPARISON:  None. FINDINGS: CT HEAD FINDINGS Brain: The brain shows a normal appearance without evidence of malformation, atrophy, old or acute small or large vessel infarction, mass lesion, hemorrhage, hydrocephalus or extra-axial collection. Vascular: No hyperdense vessel. No evidence of atherosclerotic calcification. Skull: Normal.  No traumatic finding.  No focal bone lesion. Sinuses/Orbits: Sinuses are clear. Orbits appear normal. Mastoids are clear. Other: None significant CT MAXILLOFACIAL FINDINGS Osseous: No evidence of facial fracture. Orbits: No orbital injury. Sinuses: Some mucosal thickening and fluid of the right maxillary sinus. Other sinuses are clear. Soft tissues: No soft tissue face abnormality of significance. Endotracheal tube and orogastric tube in place. CT CERVICAL SPINE FINDINGS Alignment: No malalignment. Skull base and vertebrae: No fracture or focal bone lesion. Soft tissues and spinal canal: No soft tissue neck lesion identified. Endotracheal tube and orogastric tube in place. Disc levels: No degenerative disc disease. No bony stenosis of the canal or foramina. Upper chest: Septal thickening in the upper lungs could reflect early edema. Other: None IMPRESSION: Head CT: Normal Maxillofacial CT: No traumatic finding. Some mucosal inflammatory disease of the right maxillary sinus. Cervical spine CT: No traumatic finding.  Normal. Endotracheal tube and orogastric tube in place. Septal thickening in the upper lungs consistent with pulmonary edema. Electronically Signed   By: Paulina Fusi M.D.   On: 06/15/2021 15:35   MR BRAIN WO CONTRAST  Result Date: 06/16/2021 CLINICAL DATA:  Found unresponsive. EXAM: MRI HEAD WITHOUT CONTRAST TECHNIQUE: Multiplanar, multiecho pulse sequences of the brain and surrounding structures were obtained without intravenous contrast. COMPARISON:   CT head from S-shaped. FINDINGS: Motion limited evaluation.  Within this limitation: Brain: No acute infarction, hemorrhage, hydrocephalus, extra-axial collection or mass lesion. Vascular: Major arterial flow voids are maintained at the skull base. Skull and upper cervical spine: No focal marrow replacing lesion. Sinuses/Orbits: Right maxillary sinus and right sphenoid sinus mucosal thickening. No acute orbital findings. Other: Trace bilateral mastoid fluid. IMPRESSION: No evidence of acute intracranial abnormality on this motion limited exam. Electronically Signed   By: Feliberto Harts MD   On: 06/16/2021 19:08   MR BRAIN W WO CONTRAST  Addendum Date: 06/19/2021   ADDENDUM REPORT: 06/19/2021 17:59 ADDENDUM: In addition to possible small volume subarachnoid hemorrhage, thrombosis of a small draining cortical vein is a differential consideration for the susceptibility artifact described in the report. There is no clear evidence of dural sinus thrombosis on the postcontrast imaging on this exam; however the study is limited by motion in this region. In addition to a non-contrast head CT (to evaluate for acute hemorrhage), a CTV may be useful to better evaluate this area for a thrombosed draining vein. Findings in the initial report and this addendum discussed with Dr. Tempie Hoist at 5:50 p.m. Electronically Signed   By: Feliberto Harts MD  On: 06/19/2021 17:59   Result Date: 06/19/2021 CLINICAL DATA:  Mental status change. EXAM: MRI HEAD WITHOUT AND WITH CONTRAST TECHNIQUE: Multiplanar, multiecho pulse sequences of the brain and surrounding structures were obtained without and with intravenous contrast. CONTRAST:  5.88mL GADAVIST GADOBUTROL 1 MMOL/ML IV SOLN COMPARISON:  06/16/2021. FINDINGS: Motion limited evaluation.  Within this limitation: Brain: No acute infarction, hydrocephalus, extra-axial collection or mass lesion. Likely sulcal high left frontoparietal susceptibility artifact (series 12, images 43  through 46) which was not apparent on the recent July 7th motion limited MRI. No clear signal abnormality in this region on the other motion limited sequences. No abnormal enhancement. Vascular: Major arterial flow voids are maintained at the skull base. Skull and upper cervical spine: Normal marrow signal. Sinuses/Orbits: Mild right maxillary sinus mucosal thickening. Other: Trace bilateral mastoid effusions. IMPRESSION: 1. Likely sulcal high left frontoparietal susceptibility artifact. This could potentially represent the sequela of prior subarachnoid hemorrhage; however, the finding was not apparent on the recent motion limited July 7th MRI. Therefore, recommend noncontrast head CT to help exclude acute subarachnoid hemorrhage. 2. Otherwise, no evidence of acute intracranial abnormality on this motion limited exam. 3. Mild right maxillary sinus disease with trace bilateral mastoid effusions. These results will be called to the ordering clinician or representative by the Radiologist Assistant, and communication documented in the PACS or [employer]. Electronically Signed: By: Feliberto Harts MD On: 06/19/2021 17:25   DG Chest Port 1 View  Result Date: 06/27/2021 CLINICAL DATA:  Acute on chronic respiratory failure with hypoxia. EXAM: PORTABLE CHEST 1 VIEW COMPARISON:  June 23, 2021. FINDINGS: The heart size and mediastinal contours are within normal limits. Endotracheal and feeding tubes are unchanged in position. Stable bilateral lung opacities are noted particularly in the basilar region. The visualized skeletal structures are unremarkable. IMPRESSION: Stable support apparatus.  Stable bilateral lung opacities. Electronically Signed   By: Lupita Raider M.D.   On: 06/27/2021 08:40   DG CHEST PORT 1 VIEW  Result Date: 06/23/2021 CLINICAL DATA:  Intubation EXAM: PORTABLE CHEST 1 VIEW COMPARISON:  Radiograph 06/16/2021, abdominal radiograph 06/22/2021 FINDINGS: Endotracheal tube tip terminates 3.4  cm the carina. Transesophageal tube tip terminates below the margin of imaging, likely within the duodenal sweep. Telemetry leads overlie the chest. Surgical clips noted in the right upper quadrant. Persistent bilateral hazy and patchy opacities are again seen throughout both lungs, with minimal if any significant interval clearing. No pneumothorax or effusion. Stable cardiomediastinal contours. Question some increasing air distention of loops of bowel in the upper abdomen. No acute osseous or chest wall abnormality. IMPRESSION: Endotracheal tube tip terminates 3.4 cm from the carina. Transesophageal tube tip terminates below the margins of imaging, likely within the duodenal sweep. Persistent bilateral hazy and patchy opacities, with minimal if any significant clearing from prior. Air distention of small bowel in the upper abdomen may be increased from recent comparison abdominal radiograph. Electronically Signed   By: Kreg Shropshire M.D.   On: 06/23/2021 02:23   DG Chest Port 1 View  Result Date: 06/16/2021 CLINICAL DATA:  Acute hypoxic respiratory failure. EXAM: PORTABLE CHEST 1 VIEW COMPARISON:  June 15, 2021. FINDINGS: Endotracheal tube tip is approximately 3.8 cm above the carina. Gastric tube courses below the diaphragm with the tip outside the field of view and the side port below the GE junction. Mildly improved right greater than left perihilar airspace opacities. Suspected small bilateral pleural effusions with blunting of bilateral costophrenic sulci. No visible pneumothorax on this  single semi erect radiograph. Right-sided skin fold. Cardiomediastinal silhouette is within normal limits. IMPRESSION: 1. Mildly improved right greater than left perihilar airspace opacities, which could represent aspiration, pneumonia, and/or asymmetric edema. 2. Suspected small bilateral pleural effusions. Electronically Signed   By: Feliberto Harts MD   On: 06/16/2021 07:01   DG Chest Portable 1 View  Result Date:  06/15/2021 CLINICAL DATA:  Onset headache and chest pain this morning. EXAM: PORTABLE CHEST 1 VIEW COMPARISON:  None. FINDINGS: Endotracheal tube is in place with the tip in good position just below the clavicular heads. NG tube tip is in the fundus of the stomach. There are bilateral pulmonary opacities, worse on the right. No pneumothorax or pleural effusion. Heart size is normal. IMPRESSION: ETT and NG tube in good position. Right greater than left perihilar opacities could be due to pneumonia or edema. Electronically Signed   By: Drusilla Kanner M.D.   On: 06/15/2021 14:16   DG Abd Portable 1V  Result Date: 06/22/2021 CLINICAL DATA:  Nausea and vomiting EXAM: PORTABLE ABDOMEN - 1 VIEW COMPARISON:  06/20/2021 FINDINGS: Weighted feeding catheter is again noted in the fourth portion of the duodenum. Scattered large and small bowel gas is noted. Calcification is noted in the midline of the pelvis likely representing a bladder stone. This measures 17 mm in diameter. No acute bony abnormality is noted. IMPRESSION: Central bladder calculus within the pelvis. No obstructive changes are noted. Electronically Signed   By: Alcide Clever M.D.   On: 06/22/2021 19:09   DG Abd Portable 1V  Result Date: 06/20/2021 CLINICAL DATA:  G-tube placement. EXAM: PORTABLE ABDOMEN - 1 VIEW COMPARISON:  None. FINDINGS: Feeding tube terminates in the horizontal portion of the duodenum, approaching the ligament of Treitz. Bowel gas pattern is unremarkable. Patchy airspace consolidation in the lung bases. IMPRESSION: 1. Feeding tube terminates in the distal duodenum. 2. Bibasilar airspace opacities, last evaluated by chest radiograph on 06/16/2021. Electronically Signed   By: Leanna Battles M.D.   On: 06/20/2021 12:36   DG Abd Portable 1V  Result Date: 06/17/2021 CLINICAL DATA:  Confirmation of feeding tube tip post pyloric with contrast EXAM: PORTABLE ABDOMEN - 1 VIEW COMPARISON:  Same day radiograph FINDINGS: Radiograph is  obtained after injection of 30 mL of Gastrografin through the patient's feeding tube. Contrast is seen flowing distally throughout the duodenum and into the proximal jejunum. There is no evidence of reflux into the stomach on this single image. There is a partially visualized central venous catheter tip overlying the superior aspect of the right atrium. There is no evidence of bowel obstruction. There are right upper quadrant surgical clips noted. The lung bases demonstrate patchy bilateral airspace opacities, see recent chest radiograph. IMPRESSION: Feeding tube tip is post pyloric within the proximal duodenum. Electronically Signed   By: Caprice Renshaw   On: 06/17/2021 13:16   DG Abd Portable 1V  Result Date: 06/17/2021 CLINICAL DATA:  Feeding tube placement. EXAM: PORTABLE ABDOMEN - 1 VIEW COMPARISON:  None. FINDINGS: The bowel gas pattern is normal. Distal tip of feeding tube is seen either in the distal stomach or proximal duodenum. No radio-opaque calculi or other significant radiographic abnormality are seen. IMPRESSION: Distal tip of feeding tube is seen in either the expected position of the distal stomach or proximal duodenum. Electronically Signed   By: Lupita Raider M.D.   On: 06/17/2021 11:38   EEG adult  Result Date: 06/16/2021 Charlsie Quest, MD     06/16/2021  10:11 AM Patient Name: Sonia Howard MRN: [id number] Epilepsy Attending: Charlsie Quest Referring Physician/Provider: Dr. Levon Hedger Date: 06/16/2021 Duration: 24.43 minutes Patient history: 40 year old female with history of drug overdose and unintentional weight loss brought in after she was found unresponsive at a friend's home.  Blood sugar was 23.  EEG to evaluate for seizures. Level of alertness: Comatose AEDs during EEG study: None Technical aspects: This EEG study was done with scalp electrodes positioned according to the 10-20 International system of electrode placement. Electrical activity was acquired at a sampling rate of   and reviewed with a high frequency filter of  and a low frequency filter of . EEG data were recorded continuously and digitally stored. Description: EEG showed continuous generalized 3 to 6 Hz theta-delta slowing. Generalized periodic discharges with triphasic morphology at 1 Hz were also noted.  EEG was reactive to noxious stimulation.  Hyperventilation and photic stimulation were not performed.   ABNORMALITY - Periodic discharges with triphasic morphology, generalized ( GPDs) - Continuous slow, generalized IMPRESSION: This study is suggestive of severe diffuse encephalopathy, nonspecific etiology but likely related to toxic-metabolic etiology, anoxic/hypoxic brain injury. No seizures or definite epileptiform discharges were seen throughout the recording. Sonia Howard   Overnight EEG with video  Result Date: 06/20/2021 Charlsie Quest, MD     06/21/2021  8:38 AM Patient Name: Sonia Howard MRN: [id number] Epilepsy Attending: Charlsie Quest Referring Physician/Provider: Dr. Brooke Dare Duration: 06/19/2021 2229 to 06/20/2021 1830  Patient history: 40 year old female with history of drug overdose and unintentional weight loss brought in after she was found unresponsive at a friend's home.  Blood sugar was 23.  EEG to evaluate for seizures.  Level of alertness: Comatose  AEDs during EEG study: None  Technical aspects: This EEG study was done with scalp electrodes positioned according to the 10-20 International system of electrode placement. Electrical activity was acquired at a sampling rate of  and reviewed with a high frequency filter of  and a low frequency filter of . EEG data were recorded continuously and digitally stored.  Description: EEG showed continuous generalized 3 to 6 Hz theta-delta slowing. Generalized periodic discharges with triphasic morphology at 0.5-1 Hz were also noted.  EEG was reactive to noxious stimulation.  Hyperventilation and photic stimulation were not  performed.    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 No seizures or definite epileptiform discharges were seen throughout the recording.  Sonia Howard   Overnight EEG with video  Result Date: 06/18/2021 Charlsie Quest, MD     06/19/2021  9:19 AM Patient Name: Sonia Howard MRN: [id number] Epilepsy Attending: Charlsie Quest Referring Physician/Provider: Dr. Levon Hedger Duration: 06/17/2021 1010 to 06/18/2021 1141  Patient history: 40 year old female with history of drug overdose and unintentional weight loss brought in after she was found unresponsive at a friend's home.  Blood sugar was 23.  EEG to evaluate for seizures.  Level of alertness: Comatose  AEDs during EEG study: None  Technical aspects: This EEG study was done with scalp electrodes positioned according to the 10-20 International system of electrode placement. Electrical activity was acquired at a sampling rate of  and reviewed with a high frequency filter of  and a low frequency filter of . EEG data were recorded continuously and digitally stored.  Description: EEG showed continuous  generalized 3 to 6 Hz theta-delta slowing. Generalized periodic discharges with triphasic morphology at 1-1.5 Hz were also noted.  EEG was reactive to noxious stimulation.  Hyperventilation and photic stimulation were not performed.    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 No seizures or definite epileptiform discharges were seen throughout the recording.  Charlsie Questriyanka O Yadav    ECHOCARDIOGRAM COMPLETE  Result Date: 06/16/2021    ECHOCARDIOGRAM REPORT   Patient Name:   Sonia Howard Date of Exam: 06/16/2021 Medical Rec #:  [id number]      Height:       68.0 in Accession #:    [id number]     Weight:       116.2 lb Date of Birth:  11-26-1981      BSA:          1.622 m Patient Age:    39 years       BP:           124/106 mmHg Patient Gender: F              HR:           100 bpm. Exam Location:  Inpatient Procedure: 2D Echo, Cardiac Doppler and Color Doppler Indications:    R94.31 Abnormal EKG  History:        Patient has no prior history of Echocardiogram examinations.                 IVDU. Sepsis. Respiratory failure.  Sonographer:    Roosvelt Maserachel Lane RDCS Referring Phys: [id number] Luetta NuttingBRANDI L OLLIS IMPRESSIONS  1. Left ventricular ejection fraction, by estimation, is 30 to 35%. The left ventricle has moderately decreased function. The left ventricle demonstrates regional wall motion abnormalities. The basal segments of the LV are severely hypokinetic. The mid-apical segments function normally. ?Reverse Takotsubo picture. The left ventricular internal cavity size was mildly dilated. Left ventricular diastolic parameters are indeterminate.  2. Right ventricular systolic function is mildly reduced. The right ventricular size is normal. Tricuspid regurgitation signal is inadequate for assessing PA pressure.  3. Left atrial size was moderately dilated.  4. The mitral valve is normal in structure. Trivial mitral valve regurgitation. No evidence of mitral stenosis.  5. The aortic valve is tricuspid. Aortic valve regurgitation is not visualized. No aortic stenosis is present.  6. The inferior vena cava is normal in size with greater than 50% respiratory variability, suggesting right atrial pressure of 3 mmHg.  7. A small pericardial effusion is present. FINDINGS  Left Ventricle: Left ventricular ejection fraction, by estimation, is 30 to 35%. The left ventricle has moderately decreased function. The left  ventricle demonstrates regional wall motion abnormalities. The left ventricular internal cavity size was mildly dilated. There is no left ventricular hypertrophy. Left ventricular diastolic parameters are indeterminate. Right Ventricle: The right ventricular size is normal. No increase in right ventricular wall thickness. Right ventricular systolic function is mildly reduced. Tricuspid regurgitation signal is inadequate for assessing PA pressure. Left Atrium: Left atrial size was moderately dilated. Right Atrium: Right atrial size was normal in size. Pericardium: A small pericardial effusion is present. Mitral Valve: The mitral valve is normal in structure. Trivial mitral valve regurgitation. No evidence of mitral valve stenosis. Tricuspid Valve: The tricuspid valve is normal in structure. Tricuspid valve regurgitation is not demonstrated. Aortic Valve: The  aortic valve is tricuspid. Aortic valve regurgitation is not visualized. No aortic stenosis is present. Pulmonic Valve: The pulmonic valve was normal in structure. Pulmonic valve regurgitation is trivial. Aorta: The aortic root is normal in size and structure. Venous: The inferior vena cava is normal in size with greater than 50% respiratory variability, suggesting right atrial pressure of 3 mmHg. IAS/Shunts: No atrial level shunt detected by color flow Doppler.  LEFT VENTRICLE PLAX 2D LVIDd:         5.10 cm LVIDs:         4.50 cm LV PW:         0.90 cm LV IVS:        0.70 cm LVOT diam:     2.00 cm LVOT Area:     3.14 cm  RIGHT VENTRICLE RV Basal diam:  3.20 cm RV Mid diam:    3.20 cm LEFT ATRIUM           Index LA diam:      3.00 cm 1.85 cm/m LA Vol (A4C): 95.9 ml 59.11 ml/m   AORTA Ao Root diam: 2.90 cm  SHUNTS Systemic Diam: 2.00 cm Marca Ancona MD Electronically signed by Marca Ancona MD Signature Date/Time: 06/16/2021/4:48:05 PM    Final    CT Maxillofacial Wo Contrast  Result Date: 06/15/2021 CLINICAL DATA:  Found on the ground.  Intoxicated. EXAM: CT  HEAD WITHOUT CONTRAST CT MAXILLOFACIAL WITHOUT CONTRAST CT CERVICAL SPINE WITHOUT CONTRAST TECHNIQUE: Multidetector CT imaging of the head, cervical spine, and maxillofacial structures were performed using the standard protocol without intravenous contrast. Multiplanar CT image reconstructions of the cervical spine and maxillofacial structures were also generated. COMPARISON:  None. FINDINGS: CT HEAD FINDINGS Brain: The brain shows a normal appearance without evidence of malformation, atrophy, old or acute small or large vessel infarction, mass lesion, hemorrhage, hydrocephalus or extra-axial collection. Vascular: No hyperdense vessel. No evidence of atherosclerotic calcification. Skull: Normal.  No traumatic finding.  No focal bone lesion. Sinuses/Orbits: Sinuses are clear. Orbits appear normal. Mastoids are clear. Other: None significant CT MAXILLOFACIAL FINDINGS Osseous: No evidence of facial fracture. Orbits: No orbital injury. Sinuses: Some mucosal thickening and fluid of the right maxillary sinus. Other sinuses are clear. Soft tissues: No soft tissue face abnormality of significance. Endotracheal tube and orogastric tube in place. CT CERVICAL SPINE FINDINGS Alignment: No malalignment. Skull base and vertebrae: No fracture or focal bone lesion. Soft tissues and spinal canal: No soft tissue neck lesion identified. Endotracheal tube and orogastric tube in place. Disc levels: No degenerative disc disease. No bony stenosis of the canal or foramina. Upper chest: Septal thickening in the upper lungs could reflect early edema. Other: None IMPRESSION: Head CT: Normal Maxillofacial CT: No traumatic finding. Some mucosal inflammatory disease of the right maxillary sinus. Cervical spine CT: No traumatic finding.  Normal. Endotracheal tube and orogastric tube in place. Septal thickening in the upper lungs consistent with pulmonary edema. Electronically Signed   By: Paulina Fusi M.D.   On: 06/15/2021 15:35     Microbiology: No results found for this or any previous visit (from the past 240 hour(s)).   Labs: Basic Metabolic Panel: Recent Labs  Lab 06/27/21 0318 06/28/21 0105 06/29/21 0109 06/30/21 0225 07/01/21 0432  NA 143 144 144 142 142  K 3.4* 3.8 3.2* 3.8 3.8  CL 105 107 106 105 104  CO2 31 31 32 31 32  GLUCOSE 100* 132* 103* 121* 126*  BUN CREATININE  0.37* 0.44 0.42* 0.39* 0.40*  CALCIUM 8.6* 8.5* 8.6* 8.6* 8.7*  MG 2.4  --   --  2.2  --   PHOS 4.1  --   --  3.2  --    Liver Function Tests: No results for input(s): AST, ALT, ALKPHOS, BILITOT, PROT, ALBUMIN in the last 168 hours. No results for input(s): LIPASE, AMYLASE in the last 168 hours. No results for input(s): AMMONIA in the last 168 hours. CBC: Recent Labs  Lab 06/27/21 0318 06/28/21 0105 06/29/21 0109 07/01/21 0432  WBC 11.6* 10.9* 9.5 9.6  HGB 9.9* 10.1* 10.8* 11.5*  HCT 32.1* 33.3* 35.6* 37.4  MCV 90.7 91.7 92.7 92.3  PLT 323 552* 567* 561*   Cardiac Enzymes: No results for input(s): CKTOTAL, CKMB, CKMBINDEX, TROPONINI in the last 168 hours. BNP: BNP (last 3 results) Recent Labs    06/16/21 0034  BNP 1,055.3*    ProBNP (last 3 results) No results for input(s): PROBNP in the last 8760 hours.  CBG: Recent Labs  Lab 06/30/21 0320 07/01/21 0711 07/01/21 1111  GLUCAP 131* 122* 104*       Signed:  Myrtie Neither, MD [facility] 07/03/2021, 12:37 PM 30

## 2021-07-03 NOTE — Progress Notes (Signed)
Comfort Care patient. Pt resting comfortably in bed with lights off and TV on.

## 2021-07-03 NOTE — Progress Notes (Signed)
Civil engineer, contracting [facility]) Hospital Liaison note    Chart reviewed and eligibility confirmed for Hospice Home in [place]. Family agreeable to transfer today. TOC aware.    Registration paperwork has been completed.  Please arrange transport.   RN please call report to [phone].  Please be sure the signed DNR transports with the patient.  Thank you for the opportunity to participate in this patient's care.  Gillian Scarce, BSN, RN [facility] Liaison (listed on [facility] under [facility])    [phone] (24h on call)

## 2021-07-03 NOTE — Progress Notes (Signed)
Pt to be transferred to [facility] in [place]. Report called to Talbert Forest, RN . Awaiting discharge summary from MD to fax to facility.

## 2021-07-03 NOTE — TOC Transition Note (Signed)
Transition of Care [facility]) - CM/SW Discharge Note   Patient Details  Name: Sonia Howard MRN: [id number] Date of Birth: Apr 19, 1981  Transition of Care [facility]) CM/SW Contact:  Levada Schilling Phone Number: 07/03/2021, 1:11 PM   Clinical Narrative:    Patient will Discharge To: [facility] Anticipated DC Date:07/03/21 Family Notified:yes, Dimas [place], [phone] Transport NT:[id number]   Per MD patient ready for DC to [facility]. RN, patient, patient's family, and facility notified of DC. Assessment, Fl2/Pasrr, and Discharge Summary sent to facility. RN given number for report [phone]). DC packet on chart. Ambulance transport requested for patient.   CSW signing off.  Budd Palmer [facility] [phone]     Final next level of care: Skilled Nursing Facility Barriers to Discharge: No Barriers Identified   Patient Goals and CMS Choice        Discharge Placement                       Discharge Plan and Services In-house Referral: Clinical Social Work, Hospice / Palliative Care Discharge Planning Services: CM Consult                                 Social Determinants of Health (SDOH) Interventions     Readmission Risk Interventions No flowsheet data found.

## 2022-10-20 LAB — PROINSULIN/INSULIN RATIO
Insulin: 0.85 u[IU]/mL
Proinsulin: <1.3 pmol/L
